# Patient Record
Sex: Male | Born: 1945 | Hispanic: Yes | Marital: Married | Smoking: Never smoker
Health system: Southern US, Community
[De-identification: ages and names within clinical notes are randomized; demographics above are authoritative.]

## PROBLEM LIST (undated history)

## (undated) DIAGNOSIS — C61 Malignant neoplasm of prostate: Secondary | ICD-10-CM

## (undated) DIAGNOSIS — I10 Essential (primary) hypertension: Secondary | ICD-10-CM

## (undated) DIAGNOSIS — E119 Type 2 diabetes mellitus without complications: Secondary | ICD-10-CM

## (undated) HISTORY — PX: OTHER SURGICAL HISTORY: SHX169

## (undated) HISTORY — PX: JOINT REPLACEMENT: SHX530

## (undated) HISTORY — PX: HERNIA REPAIR: SHX51

---

## 2002-12-24 HISTORY — PX: HERNIA REPAIR: SHX51

## 2005-09-14 ENCOUNTER — Emergency Department: Payer: Self-pay | Admitting: Emergency Medicine

## 2007-01-16 ENCOUNTER — Ambulatory Visit: Payer: Self-pay | Admitting: General Surgery

## 2011-03-04 ENCOUNTER — Emergency Department: Payer: Self-pay | Admitting: Emergency Medicine

## 2013-04-27 DIAGNOSIS — C61 Malignant neoplasm of prostate: Secondary | ICD-10-CM | POA: Insufficient documentation

## 2017-10-01 DIAGNOSIS — E119 Type 2 diabetes mellitus without complications: Secondary | ICD-10-CM | POA: Insufficient documentation

## 2017-10-22 DIAGNOSIS — E66811 Obesity, class 1: Secondary | ICD-10-CM | POA: Insufficient documentation

## 2017-10-22 DIAGNOSIS — E669 Obesity, unspecified: Secondary | ICD-10-CM | POA: Insufficient documentation

## 2017-10-22 DIAGNOSIS — M1711 Unilateral primary osteoarthritis, right knee: Secondary | ICD-10-CM | POA: Insufficient documentation

## 2017-10-23 ENCOUNTER — Other Ambulatory Visit: Payer: Self-pay | Admitting: Orthopedic Surgery

## 2017-10-23 DIAGNOSIS — G8929 Other chronic pain: Secondary | ICD-10-CM

## 2017-10-23 DIAGNOSIS — M25561 Pain in right knee: Principal | ICD-10-CM

## 2017-11-18 ENCOUNTER — Ambulatory Visit
Admission: RE | Admit: 2017-11-18 | Discharge: 2017-11-18 | Disposition: A | Discharge status: Home / Self Care | Payer: Medicare Other | Source: Ambulatory Visit | Attending: Orthopedic Surgery | Admitting: Orthopedic Surgery

## 2017-11-18 DIAGNOSIS — S83241A Other tear of medial meniscus, current injury, right knee, initial encounter: Secondary | ICD-10-CM | POA: Diagnosis not present

## 2017-11-18 DIAGNOSIS — X58XXXA Exposure to other specified factors, initial encounter: Secondary | ICD-10-CM | POA: Diagnosis not present

## 2017-11-18 DIAGNOSIS — M949 Disorder of cartilage, unspecified: Secondary | ICD-10-CM | POA: Insufficient documentation

## 2017-11-18 DIAGNOSIS — G8929 Other chronic pain: Secondary | ICD-10-CM | POA: Diagnosis not present

## 2017-11-18 DIAGNOSIS — M25561 Pain in right knee: Secondary | ICD-10-CM | POA: Diagnosis not present

## 2017-11-18 DIAGNOSIS — M25461 Effusion, right knee: Secondary | ICD-10-CM | POA: Diagnosis not present

## 2018-06-24 ENCOUNTER — Encounter: Payer: Self-pay | Admitting: Emergency Medicine

## 2018-06-24 ENCOUNTER — Other Ambulatory Visit: Payer: Self-pay

## 2018-06-24 ENCOUNTER — Inpatient Hospital Stay
Admission: EM | Admit: 2018-06-24 | Discharge: 2018-06-29 | DRG: 872 | Disposition: A | Discharge status: Home / Self Care | Payer: Medicare Other | Attending: Internal Medicine | Admitting: Internal Medicine

## 2018-06-24 ENCOUNTER — Emergency Department: Payer: Medicare Other

## 2018-06-24 DIAGNOSIS — K659 Peritonitis, unspecified: Secondary | ICD-10-CM | POA: Diagnosis present

## 2018-06-24 DIAGNOSIS — N179 Acute kidney failure, unspecified: Secondary | ICD-10-CM | POA: Diagnosis present

## 2018-06-24 DIAGNOSIS — Z809 Family history of malignant neoplasm, unspecified: Secondary | ICD-10-CM | POA: Diagnosis not present

## 2018-06-24 DIAGNOSIS — E876 Hypokalemia: Secondary | ICD-10-CM | POA: Diagnosis not present

## 2018-06-24 DIAGNOSIS — E119 Type 2 diabetes mellitus without complications: Secondary | ICD-10-CM | POA: Diagnosis present

## 2018-06-24 DIAGNOSIS — R1030 Lower abdominal pain, unspecified: Secondary | ICD-10-CM | POA: Diagnosis present

## 2018-06-24 DIAGNOSIS — E872 Acidosis: Secondary | ICD-10-CM | POA: Diagnosis present

## 2018-06-24 DIAGNOSIS — A020 Salmonella enteritis: Secondary | ICD-10-CM | POA: Diagnosis present

## 2018-06-24 DIAGNOSIS — Z833 Family history of diabetes mellitus: Secondary | ICD-10-CM

## 2018-06-24 DIAGNOSIS — E118 Type 2 diabetes mellitus with unspecified complications: Secondary | ICD-10-CM

## 2018-06-24 DIAGNOSIS — A419 Sepsis, unspecified organism: Secondary | ICD-10-CM

## 2018-06-24 DIAGNOSIS — R6520 Severe sepsis without septic shock: Secondary | ICD-10-CM | POA: Diagnosis present

## 2018-06-24 DIAGNOSIS — K529 Noninfective gastroenteritis and colitis, unspecified: Secondary | ICD-10-CM

## 2018-06-24 DIAGNOSIS — Z8546 Personal history of malignant neoplasm of prostate: Secondary | ICD-10-CM | POA: Diagnosis not present

## 2018-06-24 DIAGNOSIS — A021 Salmonella sepsis: Secondary | ICD-10-CM | POA: Diagnosis present

## 2018-06-24 HISTORY — DX: Type 2 diabetes mellitus without complications: E11.9

## 2018-06-24 HISTORY — DX: Malignant neoplasm of prostate: C61

## 2018-06-24 LAB — CBC WITH DIFFERENTIAL/PLATELET
Basophils Absolute: 0 10*3/uL (ref 0–0.1)
Basophils Relative: 0 %
Eosinophils Absolute: 0 10*3/uL (ref 0–0.7)
Eosinophils Relative: 0 %
HCT: 42.1 % (ref 40.0–52.0)
HEMOGLOBIN: 14.6 g/dL (ref 13.0–18.0)
LYMPHS PCT: 6 %
Lymphs Abs: 0.6 10*3/uL — ABNORMAL LOW (ref 1.0–3.6)
MCH: 31.4 pg (ref 26.0–34.0)
MCHC: 34.6 g/dL (ref 32.0–36.0)
MCV: 90.8 fL (ref 80.0–100.0)
MONO ABS: 0.3 10*3/uL (ref 0.2–1.0)
Monocytes Relative: 4 %
NEUTROS ABS: 7.8 10*3/uL — AB (ref 1.4–6.5)
NEUTROS PCT: 90 %
PLATELETS: 226 10*3/uL (ref 150–440)
RBC: 4.64 MIL/uL (ref 4.40–5.90)
RDW: 14.7 % — ABNORMAL HIGH (ref 11.5–14.5)
WBC: 8.7 10*3/uL (ref 3.8–10.6)

## 2018-06-24 LAB — COMPREHENSIVE METABOLIC PANEL
ALK PHOS: 44 U/L (ref 38–126)
ALT: 16 U/L (ref 0–44)
ANION GAP: 14 (ref 5–15)
AST: 25 U/L (ref 15–41)
Albumin: 4.4 g/dL (ref 3.5–5.0)
BILIRUBIN TOTAL: 2.3 mg/dL — AB (ref 0.3–1.2)
BUN: 29 mg/dL — ABNORMAL HIGH (ref 8–23)
CALCIUM: 9.1 mg/dL (ref 8.9–10.3)
CO2: 18 mmol/L — ABNORMAL LOW (ref 22–32)
Chloride: 96 mmol/L — ABNORMAL LOW (ref 98–111)
Creatinine, Ser: 2.14 mg/dL — ABNORMAL HIGH (ref 0.61–1.24)
GFR calc Af Amer: 34 mL/min — ABNORMAL LOW (ref >60)
GFR, EST NON AFRICAN AMERICAN: 29 mL/min — AB (ref >60)
Glucose, Bld: 160 mg/dL — ABNORMAL HIGH (ref 70–99)
POTASSIUM: 3.4 mmol/L — AB (ref 3.5–5.1)
Sodium: 128 mmol/L — ABNORMAL LOW (ref 135–145)
TOTAL PROTEIN: 7.9 g/dL (ref 6.5–8.1)

## 2018-06-24 LAB — URINALYSIS, ROUTINE W REFLEX MICROSCOPIC
Bilirubin Urine: NEGATIVE
Glucose, UA: NEGATIVE mg/dL
Ketones, ur: NEGATIVE mg/dL
Leukocytes, UA: NEGATIVE
Nitrite: NEGATIVE
PROTEIN: 30 mg/dL — AB
Specific Gravity, Urine: 1.02 (ref 1.005–1.030)
pH: 5 (ref 5.0–8.0)

## 2018-06-24 LAB — C DIFFICILE QUICK SCREEN W PCR REFLEX
C Diff antigen: NEGATIVE
C Diff interpretation: NOT DETECTED
C Diff toxin: NEGATIVE

## 2018-06-24 LAB — LACTIC ACID, PLASMA: Lactic Acid, Venous: 1.7 mmol/L (ref 0.5–1.9)

## 2018-06-24 LAB — TROPONIN I

## 2018-06-24 MED ORDER — ONDANSETRON HCL 4 MG/2ML IJ SOLN
4.0000 mg | Freq: Once | INTRAMUSCULAR | Status: AC
  Administered 2018-06-24: 4 mg via INTRAVENOUS
  Filled 2018-06-24: qty 2

## 2018-06-24 MED ORDER — SODIUM CHLORIDE 0.9 % IV BOLUS (SEPSIS)
1000.0000 mL | Freq: Once | INTRAVENOUS | Status: AC
  Administered 2018-06-24: 1000 mL via INTRAVENOUS

## 2018-06-24 MED ORDER — VANCOMYCIN HCL IN DEXTROSE 1-5 GM/200ML-% IV SOLN
1000.0000 mg | Freq: Once | INTRAVENOUS | Status: AC
  Administered 2018-06-24: 1000 mg via INTRAVENOUS
  Filled 2018-06-24: qty 200

## 2018-06-24 MED ORDER — ACETAMINOPHEN 325 MG PO TABS
650.0000 mg | ORAL_TABLET | Freq: Once | ORAL | Status: AC
  Administered 2018-06-24: 650 mg via ORAL

## 2018-06-24 MED ORDER — ACETAMINOPHEN 325 MG PO TABS
ORAL_TABLET | ORAL | Status: AC
Start: 2018-06-24 — End: 2018-06-24
  Administered 2018-06-24: 650 mg via ORAL
  Filled 2018-06-24: qty 2

## 2018-06-24 MED ORDER — PIPERACILLIN-TAZOBACTAM 3.375 G IVPB 30 MIN
3.3750 g | Freq: Once | INTRAVENOUS | Status: AC
  Administered 2018-06-24: 3.375 g via INTRAVENOUS
  Filled 2018-06-24: qty 50

## 2018-06-24 NOTE — Progress Notes (Signed)
CODE SEPSIS - PHARMACY COMMUNICATION  **Broad Spectrum Antibiotics should be administered within 1 hour of Sepsis diagnosis**  Time Code Sepsis Called/Page Received: 1934  Antibiotics Ordered: vanc/Zosyn  Time of 1st antibiotic administration: 2027  Additional action taken by pharmacy: Called MD for orders at 2017, Dr Clearnce Hasten stated he would put in broad spectrum abx Called RN at 2023 about timing of abx admin  If necessary, Name of Provider/Nurse Contacted: see above    Rocky Morel ,PharmD Clinical Pharmacist  06/24/2018  7:37 PM
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
## 2018-06-24 NOTE — H&P (Signed)
[place] at [place] NAME: Charles Cooke    MR#:  [id number]  DATE OF BIRTH:  1946/03/18  DATE OF ADMISSION:  06/24/2018  PRIMARY CARE PHYSICIAN: Alene Mires Elyse Jarvis, MD   REQUESTING/REFERRING PHYSICIAN: Clearnce Hasten, MD  CHIEF COMPLAINT:   Chief Complaint  Patient presents with  . Weakness    HISTORY OF PRESENT ILLNESS:  Charles Cooke  is a 72 y.o. male who presents with 3 days of intermittent fever and diarrhea.  Patient denies any blood in his diarrhea or significant abdominal pain.  Here in the ED he was found to meet sepsis criteria with bandemia, fever, tachycardia.  He has some mild AKI.  Patient states no one else around him has experienced any similar symptoms.  Hospitalist were called for admission for severe sepsis with suspected intra-abdominal infection  PAST MEDICAL HISTORY:   Past Medical History:  Diagnosis Date  . Diabetes mellitus without complication ([place])   . Prostate cancer ([place])      PAST SURGICAL HISTORY:   Past Surgical History:  Procedure Laterality Date  . HERNIA REPAIR    . prostate cancer       SOCIAL HISTORY:   Social History   Tobacco Use  . Smoking status: Never Smoker  . Smokeless tobacco: Never Used  Substance Use Topics  . Alcohol use: Yes    Comment: occasional     FAMILY HISTORY:   Family History  Problem Relation Age of Onset  . Diabetes Mother   . Cancer Father     Family history reviewed and is noncontributory. DRUG ALLERGIES:  No Known Allergies  MEDICATIONS AT HOME:   Prior to Admission medications   Not on File    REVIEW OF SYSTEMS:  Review of Systems  Constitutional: Positive for fever. Negative for chills, malaise/fatigue and weight loss.  HENT: Negative for ear pain, hearing loss and tinnitus.   Eyes: Negative for blurred vision, double vision, pain and redness.  Respiratory: Negative for cough, hemoptysis and shortness of breath.    Cardiovascular: Negative for chest pain, palpitations, orthopnea and leg swelling.  Gastrointestinal: Positive for diarrhea, nausea and vomiting. Negative for abdominal pain and constipation.  Genitourinary: Negative for dysuria, frequency and hematuria.  Musculoskeletal: Negative for back pain, joint pain and neck pain.  Skin:       No acne, rash, or lesions  Neurological: Negative for dizziness, tremors, focal weakness and weakness.  Endo/Heme/Allergies: Negative for polydipsia. Does not bruise/bleed easily.  Psychiatric/Behavioral: Negative for depression. The patient is not nervous/anxious and does not have insomnia.      VITAL SIGNS:   Vitals:   06/24/18 1941 06/24/18 2000 06/24/18 2100 06/24/18 2217  BP: (!) 98/58 98/60 (!) 100/58   Pulse: (!) 119 (!) 118 (!) 110   Resp: (!) 34 (!) 28    Temp: (!) 100.8 F (38.2 C)   (!) 102 F (38.9 C)  TempSrc: Oral   Oral  SpO2: 95% 94% 97%   Weight:      Height:       Wt Readings from Last 3 Encounters:  06/24/18 85.7 kg (189 lb)    PHYSICAL EXAMINATION:  Physical Exam  Vitals reviewed. Constitutional: He is oriented to person, place, and time. He appears well-developed and well-nourished. No distress.  HENT:  Head: Normocephalic and atraumatic.  Dry mucous membranes  Eyes: Pupils are equal, round, and reactive to light. Conjunctivae and EOM are normal. No scleral icterus.  Neck: Normal range of motion. Neck supple. No JVD present. No thyromegaly present.  Cardiovascular: Regular rhythm and intact distal pulses. Exam reveals no gallop and no friction rub.  No murmur heard. Tachycardic  Respiratory: Effort normal and breath sounds normal. No respiratory distress. He has no wheezes. He has no rales.  GI: Soft. Bowel sounds are normal. He exhibits no distension. There is no tenderness.  Musculoskeletal: Normal range of motion. He exhibits no edema.  No arthritis, no gout  Lymphadenopathy:    He has no cervical adenopathy.   Neurological: He is alert and oriented to person, place, and time. No cranial nerve deficit.  No dysarthria, no aphasia  Skin: Skin is warm and dry. No rash noted. No erythema.  Psychiatric: He has a normal mood and affect. His behavior is normal. Judgment and thought content normal.    LABORATORY PANEL:   CBC Recent Labs  Lab 06/24/18 2011  WBC 8.7  HGB 14.6  HCT 42.1  PLT 226   ------------------------------------------------------------------------------------------------------------------  Chemistries  Recent Labs  Lab 06/24/18 2011  NA 128*  K 3.4*  CL 96*  CO2 18*  GLUCOSE 160*  BUN 29*  CREATININE 2.14*  CALCIUM 9.1  AST 25  ALT 16  ALKPHOS 44  BILITOT 2.3*   ------------------------------------------------------------------------------------------------------------------  Cardiac Enzymes Recent Labs  Lab 06/24/18 2011  TROPONINI <0.03   ------------------------------------------------------------------------------------------------------------------  RADIOLOGY:  Ct Abdomen Pelvis Wo Contrast  Result Date: 06/24/2018 CLINICAL DATA:  72 year old male with acute generalized abdominal and pelvic pain. EXAM: CT ABDOMEN AND PELVIS WITHOUT CONTRAST TECHNIQUE: Multidetector CT imaging of the abdomen and pelvis was performed following the standard protocol without IV contrast. COMPARISON:  None. FINDINGS: Please note that parenchymal abnormalities may be missed without intravenous contrast. Lower chest: No acute abnormality. Hepatobiliary: Hepatic steatosis identified. The gallbladder is unremarkable. No biliary dilatation. Pancreas: Unremarkable Spleen: Unremarkable Adrenals/Urinary Tract: The kidneys, adrenal glands and bladder are unremarkable except for a RIGHT renal cyst. Stomach/Bowel: Fluid within the colon may represent a diarrheal state. There is no evidence of bowel obstruction or definite bowel wall thickening. The appendix is normal. Vascular/Lymphatic:  Aortic atherosclerosis. No enlarged abdominal or pelvic lymph nodes. Reproductive: Metallic clips/seeds within the prostate noted. Other: No ascites, pneumoperitoneum or focal collection. Inguinal hernia repair changes identified. Musculoskeletal: Mild compression of the T12 SUPERIOR endplate appears chronic but correlate clinically. No acute or suspicious bony abnormalities noted. IMPRESSION: 1. Fluid throughout the colon which may represent a diarrheal state. No other bowel abnormalities identified. 2. T12 SUPERIOR endplate compression-appears chronic but correlate with pain. 3. Hepatic steatosis 4.  Aortic Atherosclerosis (ICD10-I70.0). Electronically Signed   By: Margarette Canada M.D.   On: 06/24/2018 21:44   Dg Chest 1 View  Result Date: 06/24/2018 CLINICAL DATA:  Fever, weakness EXAM: CHEST  1 VIEW COMPARISON:  None. FINDINGS: The heart size and mediastinal contours are within normal limits. Both lungs are clear. The visualized skeletal structures are unremarkable. IMPRESSION: No active disease. Electronically Signed   By: Kathreen Devoid   On: 06/24/2018 20:37    EKG:   Orders placed or performed during the hospital encounter of 06/24/18  . ED EKG 12-Lead  . ED EKG 12-Lead  . EKG 12-Lead  . EKG 12-Lead    IMPRESSION AND PLAN:  Principal Problem:   Severe sepsis ([place]) -due to intra-abdominal infection, lactic acid within normal limits, patient is hemodynamically stable, IV antibiotics administered, cultures sent, C. difficile and GI panel pending Active Problems:   Abdominal  infection ([place]) -C. difficile and GI panel pending, antibiotics as above   AKI (acute kidney injury) ([place]) -IV fluids, avoid nephrotoxins and monitor   Diabetes ([place]) -sliding scale insulin with corresponding glucose checks  Chart review performed and case discussed with ED provider. Labs, imaging and/or ECG reviewed by provider and discussed with patient/family. Management plans discussed with the patient and/or  family.  DVT PROPHYLAXIS: SubQ lovenox  GI PROPHYLAXIS: None  ADMISSION STATUS: Inpatient  CODE STATUS: Full  TOTAL TIME TAKING CARE OF THIS PATIENT: 45 minutes.   Gracyn Allor [place] 06/24/2018, 11:45 PM  [employer]  [phone]  CC: Primary care physician; Theotis Burrow, MD  Note:  This document was prepared using Dragon voice recognition software and may include unintentional dictation errors.

## 2018-06-24 NOTE — ED Triage Notes (Addendum)
Pt to triage via w/c with no distress noted; pt reports lives with son who noted since this morning pt had slurred speech and "hasn't ate all day" with unsteady gait;; pt denies pain but c/o weakness ; denies c/o numbness/tingling or dizziness; pt A&Ox3, MAEW, grips strong & equal; smile symmetrical

## 2018-06-25 ENCOUNTER — Other Ambulatory Visit: Payer: Self-pay

## 2018-06-25 LAB — GLUCOSE, CAPILLARY
GLUCOSE-CAPILLARY: 152 mg/dL — AB (ref 70–99)
GLUCOSE-CAPILLARY: 190 mg/dL — AB (ref 70–99)
Glucose-Capillary: 167 mg/dL — ABNORMAL HIGH (ref 70–99)
Glucose-Capillary: 167 mg/dL — ABNORMAL HIGH (ref 70–99)
Glucose-Capillary: 167 mg/dL — ABNORMAL HIGH (ref 70–99)

## 2018-06-25 LAB — CBC
HCT: 37.8 % — ABNORMAL LOW (ref 40.0–52.0)
HEMOGLOBIN: 13.3 g/dL (ref 13.0–18.0)
MCH: 31.8 pg (ref 26.0–34.0)
MCHC: 35.2 g/dL (ref 32.0–36.0)
MCV: 90.2 fL (ref 80.0–100.0)
Platelets: 167 10*3/uL (ref 150–440)
RBC: 4.19 MIL/uL — ABNORMAL LOW (ref 4.40–5.90)
RDW: 15 % — ABNORMAL HIGH (ref 11.5–14.5)
WBC: 5.7 10*3/uL (ref 3.8–10.6)

## 2018-06-25 LAB — MAGNESIUM: MAGNESIUM: 1.5 mg/dL — AB (ref 1.7–2.4)

## 2018-06-25 LAB — GASTROINTESTINAL PANEL BY PCR, STOOL (REPLACES STOOL CULTURE)
Adenovirus F40/41: NOT DETECTED
Astrovirus: NOT DETECTED
CAMPYLOBACTER SPECIES: NOT DETECTED
CRYPTOSPORIDIUM: NOT DETECTED
CYCLOSPORA CAYETANENSIS: NOT DETECTED
ENTEROTOXIGENIC E COLI (ETEC): NOT DETECTED
Entamoeba histolytica: NOT DETECTED
Enteroaggregative E coli (EAEC): NOT DETECTED
Enteropathogenic E coli (EPEC): NOT DETECTED
Giardia lamblia: NOT DETECTED
Norovirus GI/GII: NOT DETECTED
PLESIMONAS SHIGELLOIDES: NOT DETECTED
ROTAVIRUS A: NOT DETECTED
SAPOVIRUS (I, II, IV, AND V): NOT DETECTED
SHIGA LIKE TOXIN PRODUCING E COLI (STEC): NOT DETECTED
Salmonella species: DETECTED — AB
Shigella/Enteroinvasive E coli (EIEC): NOT DETECTED
VIBRIO SPECIES: NOT DETECTED
Vibrio cholerae: NOT DETECTED
Yersinia enterocolitica: NOT DETECTED

## 2018-06-25 LAB — BASIC METABOLIC PANEL
ANION GAP: 10 (ref 5–15)
BUN: 25 mg/dL — ABNORMAL HIGH (ref 8–23)
CALCIUM: 8.1 mg/dL — AB (ref 8.9–10.3)
CO2: 18 mmol/L — ABNORMAL LOW (ref 22–32)
Chloride: 102 mmol/L (ref 98–111)
Creatinine, Ser: 1.23 mg/dL (ref 0.61–1.24)
GFR, EST NON AFRICAN AMERICAN: 57 mL/min — AB (ref >60)
Glucose, Bld: 181 mg/dL — ABNORMAL HIGH (ref 70–99)
Potassium: 2.9 mmol/L — ABNORMAL LOW (ref 3.5–5.1)
SODIUM: 130 mmol/L — AB (ref 135–145)

## 2018-06-25 LAB — POTASSIUM: Potassium: 3.4 mmol/L — ABNORMAL LOW (ref 3.5–5.1)

## 2018-06-25 MED ORDER — CIPROFLOXACIN HCL 500 MG PO TABS
500.0000 mg | ORAL_TABLET | Freq: Two times a day (BID) | ORAL | Status: DC
  Administered 2018-06-25 – 2018-06-29 (×9): 500 mg via ORAL
  Filled 2018-06-25 (×10): qty 1

## 2018-06-25 MED ORDER — VANCOMYCIN HCL IN DEXTROSE 1-5 GM/200ML-% IV SOLN
1000.0000 mg | INTRAVENOUS | Status: DC
  Administered 2018-06-25: 1000 mg via INTRAVENOUS
  Filled 2018-06-25 (×2): qty 200

## 2018-06-25 MED ORDER — INSULIN ASPART 100 UNIT/ML ~~LOC~~ SOLN
0.0000 [IU] | Freq: Every day | SUBCUTANEOUS | Status: DC
  Administered 2018-06-26 – 2018-06-27 (×2): 2 [IU] via SUBCUTANEOUS
  Filled 2018-06-25 (×2): qty 1

## 2018-06-25 MED ORDER — ENOXAPARIN SODIUM 30 MG/0.3ML ~~LOC~~ SOLN
30.0000 mg | SUBCUTANEOUS | Status: DC
  Administered 2018-06-25: 30 mg via SUBCUTANEOUS
  Filled 2018-06-25: qty 0.3

## 2018-06-25 MED ORDER — ONDANSETRON HCL 4 MG PO TABS
4.0000 mg | ORAL_TABLET | Freq: Four times a day (QID) | ORAL | Status: DC | PRN
  Administered 2018-06-27: 4 mg via ORAL
  Filled 2018-06-25: qty 1

## 2018-06-25 MED ORDER — INSULIN ASPART 100 UNIT/ML ~~LOC~~ SOLN
0.0000 [IU] | Freq: Three times a day (TID) | SUBCUTANEOUS | Status: DC
  Administered 2018-06-25 (×3): 2 [IU] via SUBCUTANEOUS
  Administered 2018-06-26 (×2): 3 [IU] via SUBCUTANEOUS
  Administered 2018-06-26 – 2018-06-27 (×2): 2 [IU] via SUBCUTANEOUS
  Administered 2018-06-27: 3 [IU] via SUBCUTANEOUS
  Administered 2018-06-27: 2 [IU] via SUBCUTANEOUS
  Administered 2018-06-28 (×2): 1 [IU] via SUBCUTANEOUS
  Administered 2018-06-28: 2 [IU] via SUBCUTANEOUS
  Filled 2018-06-25 (×12): qty 1

## 2018-06-25 MED ORDER — MAGNESIUM SULFATE 4 GM/100ML IV SOLN
4.0000 g | Freq: Once | INTRAVENOUS | Status: AC
  Administered 2018-06-25: 4 g via INTRAVENOUS
  Filled 2018-06-25: qty 100

## 2018-06-25 MED ORDER — SODIUM CHLORIDE 0.9 % IV SOLN
1.0000 g | Freq: Two times a day (BID) | INTRAVENOUS | Status: DC
  Filled 2018-06-25 (×3): qty 1

## 2018-06-25 MED ORDER — POTASSIUM CHLORIDE 10 MEQ/100ML IV SOLN
10.0000 meq | INTRAVENOUS | Status: AC
  Administered 2018-06-25 (×2): 10 meq via INTRAVENOUS
  Filled 2018-06-25 (×2): qty 100

## 2018-06-25 MED ORDER — ACETAMINOPHEN 650 MG RE SUPP: 650.0000 mg | Freq: Four times a day (QID) | RECTAL | Status: DC | PRN

## 2018-06-25 MED ORDER — ONDANSETRON HCL 4 MG/2ML IJ SOLN
4.0000 mg | Freq: Four times a day (QID) | INTRAMUSCULAR | Status: DC | PRN
  Administered 2018-06-25 – 2018-06-27 (×3): 4 mg via INTRAVENOUS
  Filled 2018-06-25 (×3): qty 2

## 2018-06-25 MED ORDER — POTASSIUM CHLORIDE CRYS ER 20 MEQ PO TBCR: 40.0000 meq | EXTENDED_RELEASE_TABLET | ORAL | Status: DC

## 2018-06-25 MED ORDER — POTASSIUM CHLORIDE CRYS ER 20 MEQ PO TBCR
40.0000 meq | EXTENDED_RELEASE_TABLET | ORAL | Status: DC
  Administered 2018-06-25 (×2): 40 meq via ORAL
  Filled 2018-06-25 (×2): qty 2

## 2018-06-25 MED ORDER — ACETAMINOPHEN 325 MG PO TABS
650.0000 mg | ORAL_TABLET | Freq: Four times a day (QID) | ORAL | Status: DC | PRN
  Administered 2018-06-25 – 2018-06-29 (×3): 650 mg via ORAL
  Filled 2018-06-25 (×3): qty 2

## 2018-06-25 MED ORDER — POTASSIUM CHLORIDE IN NACL 20-0.9 MEQ/L-% IV SOLN
INTRAVENOUS | Status: DC
  Administered 2018-06-25 (×2): via INTRAVENOUS
  Filled 2018-06-25 (×3): qty 1000

## 2018-06-25 MED ORDER — PNEUMOCOCCAL VAC POLYVALENT 25 MCG/0.5ML IJ INJ
0.5000 mL | INJECTION | INTRAMUSCULAR | Status: DC
  Filled 2018-06-25: qty 0.5

## 2018-06-25 MED ORDER — SODIUM CHLORIDE 0.9 % IV SOLN: INTRAVENOUS | Status: DC

## 2018-06-25 MED ORDER — ENOXAPARIN SODIUM 40 MG/0.4ML ~~LOC~~ SOLN
40.0000 mg | SUBCUTANEOUS | Status: DC
  Administered 2018-06-26 – 2018-06-29 (×4): 40 mg via SUBCUTANEOUS
  Filled 2018-06-25 (×4): qty 0.4

## 2018-06-25 NOTE — Progress Notes (Signed)
[place] at [place] NAME: Charles Cooke    MR#:  [id number]  DATE OF BIRTH:  02-04-1946  SUBJECTIVE:  the interpreter. Patient came in with profuse diarrhea for last three days. Denies abdominal pain today. No ability stools. No fever. Feels a little better. Continues with watery stools.  REVIEW OF SYSTEMS:   Review of Systems  Constitutional: Negative for chills, fever and weight loss.  HENT: Negative for ear discharge, ear pain and nosebleeds.   Eyes: Negative for blurred vision, pain and discharge.  Respiratory: Negative for sputum production, shortness of breath, wheezing and stridor.   Cardiovascular: Negative for chest pain, palpitations, orthopnea and PND.  Gastrointestinal: Positive for diarrhea. Negative for abdominal pain, nausea and vomiting.  Genitourinary: Negative for frequency and urgency.  Musculoskeletal: Negative for back pain and joint pain.  Neurological: Negative for sensory change, speech change, focal weakness and weakness.  Psychiatric/Behavioral: Negative for depression and hallucinations. The patient is not nervous/anxious.    Tolerating Diet:yes Tolerating PT: not needed  DRUG ALLERGIES:  No Known Allergies  VITALS:  Blood pressure 100/65, pulse (!) 103, temperature 99.4 F (37.4 C), temperature source Oral, resp. rate 18, height 5' (1.524 m), weight 85.1 kg (187 lb 9.6 oz), SpO2 97 %.  PHYSICAL EXAMINATION:   Physical Exam  GENERAL:  72 y.o.-year-old patient lying in the bed with no acute distress.  EYES: Pupils equal, round, reactive to light and accommodation. No scleral icterus. Extraocular muscles intact.  HEENT: Head atraumatic, normocephalic. Oropharynx and nasopharynx clear.  NECK:  Supple, no jugular venous distention. No thyroid enlargement, no tenderness.  LUNGS: Normal breath sounds bilaterally, no wheezing, rales, rhonchi. No use of accessory muscles of respiration.   CARDIOVASCULAR: S1, S2 normal. No murmurs, rubs, or gallops.  ABDOMEN: Soft, nontender, nondistended. Bowel sounds present. No organomegaly or mass.  EXTREMITIES: No cyanosis, clubbing or edema b/l.    NEUROLOGIC: Cranial nerves II through XII are intact. No focal Motor or sensory deficits b/l.   PSYCHIATRIC:  patient is alert and oriented x 3.  SKIN: No obvious rash, lesion, or ulcer.   LABORATORY PANEL:  CBC Recent Labs  Lab 06/25/18 0447  WBC 5.7  HGB 13.3  HCT 37.8*  PLT 167    Chemistries  Recent Labs  Lab 06/24/18 2011 06/25/18 0447  NA 128* 130*  K 3.4* 2.9*  CL 96* 102  CO2 18* 18*  GLUCOSE 160* 181*  BUN 29* 25*  CREATININE 2.14* 1.23  CALCIUM 9.1 8.1*  MG  --  1.5*  AST 25  --   ALT 16  --   ALKPHOS 44  --   BILITOT 2.3*  --    Cardiac Enzymes Recent Labs  Lab 06/24/18 2011  TROPONINI <0.03   RADIOLOGY:  Ct Abdomen Pelvis Wo Contrast  Result Date: 06/24/2018 CLINICAL DATA:  72 year old male with acute generalized abdominal and pelvic pain. EXAM: CT ABDOMEN AND PELVIS WITHOUT CONTRAST TECHNIQUE: Multidetector CT imaging of the abdomen and pelvis was performed following the standard protocol without IV contrast. COMPARISON:  None. FINDINGS: Please note that parenchymal abnormalities may be missed without intravenous contrast. Lower chest: No acute abnormality. Hepatobiliary: Hepatic steatosis identified. The gallbladder is unremarkable. No biliary dilatation. Pancreas: Unremarkable Spleen: Unremarkable Adrenals/Urinary Tract: The kidneys, adrenal glands and bladder are unremarkable except for a RIGHT renal cyst. Stomach/Bowel: Fluid within the colon may represent a diarrheal state. There is no evidence of bowel obstruction or definite bowel  wall thickening. The appendix is normal. Vascular/Lymphatic: Aortic atherosclerosis. No enlarged abdominal or pelvic lymph nodes. Reproductive: Metallic clips/seeds within the prostate noted. Other: No ascites,  pneumoperitoneum or focal collection. Inguinal hernia repair changes identified. Musculoskeletal: Mild compression of the T12 SUPERIOR endplate appears chronic but correlate clinically. No acute or suspicious bony abnormalities noted. IMPRESSION: 1. Fluid throughout the colon which may represent a diarrheal state. No other bowel abnormalities identified. 2. T12 SUPERIOR endplate compression-appears chronic but correlate with pain. 3. Hepatic steatosis 4.  Aortic Atherosclerosis (ICD10-I70.0). Electronically Signed   By: Margarette Canada M.D.   On: 06/24/2018 21:44   Dg Chest 1 View  Result Date: 06/24/2018 CLINICAL DATA:  Fever, weakness EXAM: CHEST  1 VIEW COMPARISON:  None. FINDINGS: The heart size and mediastinal contours are within normal limits. Both lungs are clear. The visualized skeletal structures are unremarkable. IMPRESSION: No active disease. Electronically Signed   By: Kathreen Devoid   On: 06/24/2018 20:37   ASSESSMENT AND PLAN:  Buzz Axel  is a 72 y.o. male who presents with 3 days of intermittent fever and diarrhea.  * sepsis ([place]) -due to intra-abdominal infection with Salmonella Gastroenterities -  lactic acid within normal limits, patient is hemodynamically stable -GI panel shows salmonella -cipro 500 mg bid -FLD  *  Abdominal infection ([facility])  Asb above Pt deneeis any abdominal pain  *  AKI (acute kidney injury) ([place]) -IV fluids, avoid nephrotoxins and monitor -replete K  *  Diabetes ([place]) -sliding scale insulin with corresponding glucose checks   Case discussed with Care Management/Social Worker. Management plans discussed with the patient, family and they are in agreement.  CODE STATUS: FULL  DVT Prophylaxis: lovenox  TOTAL TIME TAKING CARE OF THIS PATIENT: *25* minutes.  >50% time spent on counselling and coordination of care  POSSIBLE D/C IN 1-2* DAYS, DEPENDING ON CLINICAL CONDITION.  Note: This dictation was prepared with Dragon dictation along with  smaller phrase technology. Any transcriptional errors that result from this process are unintentional.  Fritzi Mandes M.D on 06/25/2018 at 2:17 PM  Between 7am to 6pm - Pager - [phone]  After 6pm go to [email or web address] - password [email or web address] [facility]  Office  [phone]  CC: Primary care physician; Theotis Burrow, MDPatient ID: Charles Cooke, male   DOB: Sep 15, 1946, 72 y.o.   MRN: [id number]

## 2018-06-25 NOTE — Progress Notes (Signed)
Pharmacy consulted for electrolyte replacement protocol:   Goal of therapy: Electrolytes within normal limits:  K 3.5 - 5.1 Corrected Ca 8.9 - 10.3 Phos 2.5 - 4.6 Mg 1.7 - 2.4   Assessment: Lab Results  Component Value Date   CREATININE 1.23 06/25/2018   BUN 25 (H) 06/25/2018   NA 130 (L) 06/25/2018   K 3.4 (L) 06/25/2018   CL 102 06/25/2018   CO2 18 (L) 06/25/2018    Plan: K 3.4, Will give KCl 85meq iv q1h x 2 doses and recheck in AM per protocol.   Thomasenia Sales, PharmD, MBA, [facility]

## 2018-06-25 NOTE — Progress Notes (Signed)
Spanish interpreter was use  At this time , family at bedside   translater [place]  [phone]

## 2018-06-25 NOTE — Progress Notes (Signed)
Tylenol given for elevated temperature

## 2018-06-25 NOTE — Progress Notes (Signed)
Pharmacy Antibiotic Note  Charles Cooke is a 72 y.o. male admitted on 06/24/2018 with sepsis.  Pharmacy has been consulted for vanc/meropenem dosing. Patient received vanc 1g and zosyn 3.375g IV x 1 in ED. Consult for zosyn was switched to meropenem.  Plan: Will start vanc 1g IV q24h w/ 6 hour stack  Will draw vanc trough 07/06 @ 0100 prior to 4th dose. Will start meropenem 1g IV q12h per CrCl < 50 ml/min  Ke 0.0304 T1/2 24 hrs Goal trough 15 - 20 mcg/mL  Height: 5' 4.96" (165 cm) Weight: 189 lb (85.7 kg) IBW/kg (Calculated) : 61.41  Temp (24hrs), Avg:101.5 F (38.6 C), Min:100.8 F (38.2 C), Max:102 F (38.9 C)  Recent Labs  Lab 06/24/18 2011  WBC 8.7  CREATININE 2.14*  LATICACIDVEN 1.7    Estimated Creatinine Clearance: 31.4 mL/min (A) (by C-G formula based on SCr of 2.14 mg/dL (H)).    No Known Allergies  Thank you for allowing pharmacy to be a part of this patient's care.  Tobie Lords, PharmD, BCPS Clinical Pharmacist 06/25/2018

## 2018-06-25 NOTE — Progress Notes (Signed)
Patient profile completed via Spanish translator Charles Cooke [phone]. Son is at the bedside. No complaints of pain. Patient continues to have diarrhea.

## 2018-06-25 NOTE — Plan of Care (Signed)
  Problem: Clinical Measurements: Goal: Will remain free from infection Outcome: Progressing Goal: Diagnostic test results will improve Outcome: Progressing   

## 2018-06-25 NOTE — Progress Notes (Signed)
Spanish interpreter used for assessment and plan for the evening. Charles Cooke [phone]

## 2018-06-25 NOTE — Progress Notes (Signed)
Spanish interpreter was used for med pass. Interpreter name Broughton # [phone]

## 2018-06-25 NOTE — Consult Note (Signed)
MEDICATION RELATED CONSULT NOTE - INITIAL   Pharmacy Consult for electrolytes Indication: hypokalemia  No Known Allergies  Patient Measurements: Height: 5' (152.4 cm) Weight: 187 lb 9.6 oz (85.1 kg) IBW/kg (Calculated) : 50 Adjusted Body Weight:   Vital Signs: Temp: 99.4 F (37.4 C) (07/03 0724) Temp Source: Oral (07/03 0724) BP: 100/65 (07/03 0724) Pulse Rate: 103 (07/03 0724) Intake/Output from previous day: 07/02 0701 - 07/03 0700 In: 803.3 [I.V.:600; IV Piggyback:203.3] Out: -  Intake/Output from this shift: No intake/output data recorded.  Labs: Recent Labs    06/24/18 2011 06/25/18 0447  WBC 8.7 5.7  HGB 14.6 13.3  HCT 42.1 37.8*  PLT 226 167  CREATININE 2.14* 1.23  MG  --  1.5*  ALBUMIN 4.4  --   PROT 7.9  --   AST 25  --   ALT 16  --   ALKPHOS 44  --   BILITOT 2.3*  --    Estimated Creatinine Clearance: 49.1 mL/min (by C-G formula based on SCr of 1.23 mg/dL).   Microbiology: Recent Results (from the past 720 hour(s))  Blood Culture (routine x 2)     Status: None (Preliminary result)   Collection Time: 06/24/18  8:11 PM  Result Value Ref Range Status   Specimen Description BLOOD LEFT ANTECUBITAL  Final   Special Requests   Final    BOTTLES DRAWN AEROBIC AND ANAEROBIC Blood Culture results may not be optimal due to an excessive volume of blood received in culture bottles   Culture   Final    NO GROWTH < 12 HOURS Performed at [facility], [address] [place], [place] [address]    Report Status PENDING  Incomplete  Blood Culture (routine x 2)     Status: None (Preliminary result)   Collection Time: 06/24/18  8:12 PM  Result Value Ref Range Status   Specimen Description BLOOD BLOOD LEFT WRIST  Final   Special Requests   Final    BOTTLES DRAWN AEROBIC AND ANAEROBIC Blood Culture adequate volume   Culture   Final    NO GROWTH < 12 HOURS Performed at [facility], [address] [place], [place] [address]    Report Status  PENDING  Incomplete  C difficile quick scan w PCR reflex     Status: None   Collection Time: 06/24/18 10:16 PM  Result Value Ref Range Status   C Diff antigen NEGATIVE NEGATIVE Final   C Diff toxin NEGATIVE NEGATIVE Final   C Diff interpretation No C. difficile detected.  Final    Comment: Performed at [facility], [address] [place], [place] [address]  Gastrointestinal Panel by PCR , Stool     Status: Abnormal   Collection Time: 06/24/18 10:16 PM  Result Value Ref Range Status   Campylobacter species NOT DETECTED NOT DETECTED Final   Plesimonas shigelloides NOT DETECTED NOT DETECTED Final   Salmonella species DETECTED (A) NOT DETECTED Final    Comment: RESULT CALLED TO, READ BACK BY AND VERIFIED WITH: JENNIFER DALEY ON 06/25/18 AT 0001 BY JAG    Yersinia enterocolitica NOT DETECTED NOT DETECTED Final   Vibrio species NOT DETECTED NOT DETECTED Final   Vibrio cholerae NOT DETECTED NOT DETECTED Final   Enteroaggregative E coli (EAEC) NOT DETECTED NOT DETECTED Final   Enteropathogenic E coli (EPEC) NOT DETECTED NOT DETECTED Final   Enterotoxigenic E coli (ETEC) NOT DETECTED NOT DETECTED Final   Shiga like toxin producing E coli (STEC) NOT DETECTED  NOT DETECTED Final   Shigella/Enteroinvasive E coli (EIEC) NOT DETECTED NOT DETECTED Final   Cryptosporidium NOT DETECTED NOT DETECTED Final   Cyclospora cayetanensis NOT DETECTED NOT DETECTED Final   Entamoeba histolytica NOT DETECTED NOT DETECTED Final   Giardia lamblia NOT DETECTED NOT DETECTED Final   Adenovirus F40/41 NOT DETECTED NOT DETECTED Final   Astrovirus NOT DETECTED NOT DETECTED Final   Norovirus GI/GII NOT DETECTED NOT DETECTED Final   Rotavirus A NOT DETECTED NOT DETECTED Final   Sapovirus (I, II, IV, and V) NOT DETECTED NOT DETECTED Final    Comment: Performed at [facility], [address], [place], [place] [address]    Medical History: Past Medical History:  Diagnosis Date  . Diabetes  mellitus without complication ([place])   . Prostate cancer ([place])     Medications:  Scheduled:  . ciprofloxacin  500 mg Oral BID  . [START ON 06/26/2018] enoxaparin (LOVENOX) injection  40 mg Subcutaneous Q24H  . insulin aspart  0-5 Units Subcutaneous QHS  . insulin aspart  0-9 Units Subcutaneous TID WC  . [START ON 06/26/2018] pneumococcal 23 valent vaccine  0.5 mL Intramuscular Tomorrow-1000    Assessment: Patient is a 72 year old male admitted with salmonella GI infection. Pt K has fallen to 2.9, Mg 1.5  Goal of Therapy:  Normalization of electrolytes  Plan:  K has already received 80 MEQ of KCL po this AM. Add on Mg resulted at 1.5. Will order 4g IV Mg once. Will add 20 KCL to NS running at 129ml/hr (3MEQ/hr). Will check a K level at 1500.  Ramond Dial, Pharm.D, BCPS Clinical Pharmacist 06/25/2018,12:53 PM

## 2018-06-25 NOTE — Progress Notes (Signed)
Patient's potassium level is 2.9, notified Dr. Sabra Heck.  Received orders for oral potassium.

## 2018-06-26 LAB — BASIC METABOLIC PANEL
ANION GAP: 14 (ref 5–15)
Anion gap: 13 (ref 5–15)
BUN: 30 mg/dL — ABNORMAL HIGH (ref 8–23)
BUN: 40 mg/dL — ABNORMAL HIGH (ref 8–23)
CALCIUM: 9.3 mg/dL (ref 8.9–10.3)
CO2: 19 mmol/L — AB (ref 22–32)
CO2: 20 mmol/L — ABNORMAL LOW (ref 22–32)
CREATININE: 1 mg/dL (ref 0.61–1.24)
CREATININE: 1.43 mg/dL — AB (ref 0.61–1.24)
Calcium: 8.9 mg/dL (ref 8.9–10.3)
Chloride: 97 mmol/L — ABNORMAL LOW (ref 98–111)
Chloride: 92 mmol/L — ABNORMAL LOW (ref 98–111)
GFR calc non Af Amer: >60 mL/min (ref >60)
GFR, EST AFRICAN AMERICAN: 55 mL/min — AB (ref >60)
GFR, EST NON AFRICAN AMERICAN: 47 mL/min — AB (ref >60)
GLUCOSE: 192 mg/dL — AB (ref 70–99)
Glucose, Bld: 250 mg/dL — ABNORMAL HIGH (ref 70–99)
Potassium: 3.7 mmol/L (ref 3.5–5.1)
Potassium: 3.9 mmol/L (ref 3.5–5.1)
SODIUM: 126 mmol/L — AB (ref 135–145)
Sodium: 129 mmol/L — ABNORMAL LOW (ref 135–145)

## 2018-06-26 LAB — BLOOD CULTURE ID PANEL (REFLEXED)
Acinetobacter baumannii: NOT DETECTED
Candida albicans: NOT DETECTED
Candida glabrata: NOT DETECTED
Candida krusei: NOT DETECTED
Candida parapsilosis: NOT DETECTED
Candida tropicalis: NOT DETECTED
Carbapenem resistance: NOT DETECTED
ENTEROBACTER CLOACAE COMPLEX: NOT DETECTED
ENTEROBACTERIACEAE SPECIES: DETECTED — AB
ENTEROCOCCUS SPECIES: NOT DETECTED
ESCHERICHIA COLI: NOT DETECTED
Haemophilus influenzae: NOT DETECTED
Klebsiella oxytoca: NOT DETECTED
Klebsiella pneumoniae: NOT DETECTED
LISTERIA MONOCYTOGENES: NOT DETECTED
NEISSERIA MENINGITIDIS: NOT DETECTED
PSEUDOMONAS AERUGINOSA: NOT DETECTED
Proteus species: NOT DETECTED
STREPTOCOCCUS AGALACTIAE: NOT DETECTED
STREPTOCOCCUS PNEUMONIAE: NOT DETECTED
STREPTOCOCCUS PYOGENES: NOT DETECTED
Serratia marcescens: NOT DETECTED
Staphylococcus aureus (BCID): NOT DETECTED
Staphylococcus species: NOT DETECTED
Streptococcus species: NOT DETECTED

## 2018-06-26 LAB — GLUCOSE, CAPILLARY
GLUCOSE-CAPILLARY: 244 mg/dL — AB (ref 70–99)
GLUCOSE-CAPILLARY: 232 mg/dL — AB (ref 70–99)
Glucose-Capillary: 233 mg/dL — ABNORMAL HIGH (ref 70–99)

## 2018-06-26 LAB — URINE CULTURE: Culture: NO GROWTH

## 2018-06-26 LAB — MAGNESIUM: MAGNESIUM: 3 mg/dL — AB (ref 1.7–2.4)

## 2018-06-26 MED ORDER — POTASSIUM CHLORIDE CRYS ER 20 MEQ PO TBCR
40.0000 meq | EXTENDED_RELEASE_TABLET | Freq: Two times a day (BID) | ORAL | Status: AC
  Administered 2018-06-26 (×2): 40 meq via ORAL
  Filled 2018-06-26 (×2): qty 2

## 2018-06-26 MED ORDER — SODIUM BICARBONATE 8.4 % IV SOLN
INTRAVENOUS | Status: DC
  Administered 2018-06-26 (×2): via INTRAVENOUS
  Filled 2018-06-26 (×4): qty 100

## 2018-06-26 MED ORDER — LOPERAMIDE HCL 2 MG PO CAPS
2.0000 mg | ORAL_CAPSULE | Freq: Four times a day (QID) | ORAL | Status: DC | PRN
  Administered 2018-06-26: 2 mg via ORAL
  Filled 2018-06-26: qty 1

## 2018-06-26 MED ORDER — ZOLPIDEM TARTRATE 5 MG PO TABS
5.0000 mg | ORAL_TABLET | Freq: Every evening | ORAL | Status: DC | PRN
Start: 2018-06-26 — End: 2018-06-29
  Administered 2018-06-26 – 2018-06-27 (×2): 5 mg via ORAL
  Filled 2018-06-26 (×3): qty 1

## 2018-06-26 NOTE — Progress Notes (Signed)
PHARMACY - PHYSICIAN COMMUNICATION CRITICAL VALUE ALERT - BLOOD CULTURE IDENTIFICATION (BCID)  Charles Cooke is an 72 y.o. male who presented to [facility] on 06/24/2018 with a chief complaint of weakness  Assessment:  Tachycardic w/ Tmax 102, CT Fluid throughout the colon which may represent a diarrheal state. GI panel positive for salmonella species, 1/4 GNR BCID Enterobacteriacaea no organism identified  Name of physician (or Provider) Contacted: Arta Silence  Current antibiotics: Ciprofloxacin   Changes to prescribed antibiotics recommended:  Patient is on recommended antibiotics - No changes needed  Results for orders placed or performed during the hospital encounter of 06/24/18  Blood Culture ID Panel (Reflexed) (Collected: 06/24/2018  8:12 PM)  Result Value Ref Range   Enterococcus species NOT DETECTED NOT DETECTED   Listeria monocytogenes NOT DETECTED NOT DETECTED   Staphylococcus species NOT DETECTED NOT DETECTED   Staphylococcus aureus NOT DETECTED NOT DETECTED   Streptococcus species NOT DETECTED NOT DETECTED   Streptococcus agalactiae NOT DETECTED NOT DETECTED   Streptococcus pneumoniae NOT DETECTED NOT DETECTED   Streptococcus pyogenes NOT DETECTED NOT DETECTED   Acinetobacter baumannii NOT DETECTED NOT DETECTED   Enterobacteriaceae species DETECTED (A) NOT DETECTED   Enterobacter cloacae complex NOT DETECTED NOT DETECTED   Escherichia coli NOT DETECTED NOT DETECTED   Klebsiella oxytoca NOT DETECTED NOT DETECTED   Klebsiella pneumoniae NOT DETECTED NOT DETECTED   Proteus species NOT DETECTED NOT DETECTED   Serratia marcescens NOT DETECTED NOT DETECTED   Carbapenem resistance NOT DETECTED NOT DETECTED   Haemophilus influenzae NOT DETECTED NOT DETECTED   Neisseria meningitidis NOT DETECTED NOT DETECTED   Pseudomonas aeruginosa NOT DETECTED NOT DETECTED   Candida albicans NOT DETECTED NOT DETECTED   Candida glabrata NOT DETECTED NOT DETECTED   Candida krusei  NOT DETECTED NOT DETECTED   Candida parapsilosis NOT DETECTED NOT DETECTED   Candida tropicalis NOT DETECTED NOT DETECTED   Tobie Lords, PharmD, BCPS Clinical Pharmacist 06/26/2018

## 2018-06-26 NOTE — Progress Notes (Signed)
Used translator on Haematologist # [id number] For assessment, medication, lab results, and answering questions.

## 2018-06-26 NOTE — Progress Notes (Signed)
[place] at [place] NAME: Charles Cooke    MR#:  [id number]  DATE OF BIRTH:  [birth year]  SUBJECTIVE:  the interpreter. Patient came in with profuse diarrhea for last three days. Denies abdominal pain today.  No fever. Feels a little better. Continues with watery stools.  REVIEW OF SYSTEMS:   Review of Systems  Constitutional: Negative for chills, fever and weight loss.  HENT: Negative for ear discharge, ear pain and nosebleeds.   Eyes: Negative for blurred vision, pain and discharge.  Respiratory: Negative for sputum production, shortness of breath, wheezing and stridor.   Cardiovascular: Negative for chest pain, palpitations, orthopnea and PND.  Gastrointestinal: Positive for diarrhea. Negative for abdominal pain, nausea and vomiting.  Genitourinary: Negative for frequency and urgency.  Musculoskeletal: Negative for back pain and joint pain.  Neurological: Negative for sensory change, speech change, focal weakness and weakness.  Psychiatric/Behavioral: Negative for depression and hallucinations. The patient is not nervous/anxious.    Tolerating Diet:yes Tolerating PT: not needed  DRUG ALLERGIES:  No Known Allergies  VITALS:  Blood pressure 119/82, pulse 84, temperature 98.1 F (36.7 C), temperature source Oral, resp. rate 18, height 5' (1.524 m), weight 85.1 kg (187 lb 9.6 oz), SpO2 96 %.  PHYSICAL EXAMINATION:   Physical Exam  GENERAL:  72 y.o.-year-old patient lying in the bed with no acute distress.  EYES: Pupils equal, round, reactive to light and accommodation. No scleral icterus. Extraocular muscles intact.  HEENT: Head atraumatic, normocephalic. Oropharynx and nasopharynx clear.  NECK:  Supple, no jugular venous distention. No thyroid enlargement, no tenderness.  LUNGS: Normal breath sounds bilaterally, no wheezing, rales, rhonchi. No use of accessory muscles of respiration.  CARDIOVASCULAR: S1, S2 normal. No  murmurs, rubs, or gallops.  ABDOMEN: Soft, nontender, nondistended. Bowel sounds present. No organomegaly or mass.  EXTREMITIES: No cyanosis, clubbing or edema b/l.    NEUROLOGIC: Cranial nerves II through XII are intact. No focal Motor or sensory deficits b/l.   PSYCHIATRIC:  patient is alert and oriented x 3.  SKIN: No obvious rash, lesion, or ulcer.   LABORATORY PANEL:  CBC Recent Labs  Lab 06/25/18 0447  WBC 5.7  HGB 13.3  HCT 37.8*  PLT 167    Chemistries  Recent Labs  Lab 06/24/18 2011  06/26/18 0516  NA 128*   < > 129*  K 3.4*   < > 3.7  CL 96*   < > 97*  CO2 18*   < > 19*  GLUCOSE 160*   < > 192*  BUN 29*   < > 30*  CREATININE 2.14*   < > 1.00  CALCIUM 9.1   < > 8.9  MG  --    < > 3.0*  AST 25  --   --   ALT 16  --   --   ALKPHOS 44  --   --   BILITOT 2.3*  --   --    < > = values in this interval not displayed.   Cardiac Enzymes Recent Labs  Lab 06/24/18 2011  TROPONINI <0.03   RADIOLOGY:  Ct Abdomen Pelvis Wo Contrast  Result Date: 06/24/2018 CLINICAL DATA:  72 year old male with acute generalized abdominal and pelvic pain. EXAM: CT ABDOMEN AND PELVIS WITHOUT CONTRAST TECHNIQUE: Multidetector CT imaging of the abdomen and pelvis was performed following the standard protocol without IV contrast. COMPARISON:  None. FINDINGS: Please note that parenchymal abnormalities may be missed without intravenous  contrast. Lower chest: No acute abnormality. Hepatobiliary: Hepatic steatosis identified. The gallbladder is unremarkable. No biliary dilatation. Pancreas: Unremarkable Spleen: Unremarkable Adrenals/Urinary Tract: The kidneys, adrenal glands and bladder are unremarkable except for a RIGHT renal cyst. Stomach/Bowel: Fluid within the colon may represent a diarrheal state. There is no evidence of bowel obstruction or definite bowel wall thickening. The appendix is normal. Vascular/Lymphatic: Aortic atherosclerosis. No enlarged abdominal or pelvic lymph nodes.  Reproductive: Metallic clips/seeds within the prostate noted. Other: No ascites, pneumoperitoneum or focal collection. Inguinal hernia repair changes identified. Musculoskeletal: Mild compression of the T12 SUPERIOR endplate appears chronic but correlate clinically. No acute or suspicious bony abnormalities noted. IMPRESSION: 1. Fluid throughout the colon which may represent a diarrheal state. No other bowel abnormalities identified. 2. T12 SUPERIOR endplate compression-appears chronic but correlate with pain. 3. Hepatic steatosis 4.  Aortic Atherosclerosis (ICD10-I70.0). Electronically Signed   By: Margarette Canada M.D.   On: 06/24/2018 21:44   Dg Chest 1 View  Result Date: 06/24/2018 CLINICAL DATA:  Fever, weakness EXAM: CHEST  1 VIEW COMPARISON:  None. FINDINGS: The heart size and mediastinal contours are within normal limits. Both lungs are clear. The visualized skeletal structures are unremarkable. IMPRESSION: No active disease. Electronically Signed   By: Kathreen Devoid   On: 06/24/2018 20:37   ASSESSMENT AND PLAN:  Charles Cooke  is a 72 y.o. male who presents with 3 days of intermittent fever and diarrhea.  * sepsis ([place]) -due to intra-abdominal infection with Salmonella Gastroenterities -BC 1/2 GNR  -  lactic acid within normal limits, patient is hemodynamically stable -GI panel shows salmonella -cipro 500 mg bid -FLD--advance to regular so pt eats better  *  Abdominal infection ([place]) as above Pt denies any abdominal pain  *  AKI (acute kidney injury) ([place]) with acidosis -IV fluids, avoid nephrotoxins and monitor -replete K -change to IV bicarb gtt  *  Diabetes ([facility]) -sliding scale insulin with corresponding glucose checks   Case discussed with Care Management/Social Worker. Management plans discussed with the patient, family and they are in agreement.  CODE STATUS: FULL  DVT Prophylaxis: lovenox  TOTAL TIME TAKING CARE OF THIS PATIENT: *25* minutes.  >50% time spent on  counselling and coordination of care  POSSIBLE D/C IN 1-2* DAYS, DEPENDING ON CLINICAL CONDITION.  Note: This dictation was prepared with Dragon dictation along with smaller phrase technology. Any transcriptional errors that result from this process are unintentional.  Fritzi Mandes M.D on 06/26/2018 at 1:55 PM  Between 7am to 6pm - Pager - [phone]  After 6pm go to [email or web address] - password [email or web address] [place] [facility]  Office  [phone]  CC: Primary care physician; Theotis Burrow, MDPatient ID: Charles Cooke, male   DOB: August 18, 1946, 72 y.o.   MRN: [id number]

## 2018-06-26 NOTE — Progress Notes (Signed)
Talked to Dr. Anselm Jungling about patient's multiple loose stool, patient is positive for salmonella asked if patient can have anti-diarrheal. Order for Imodium 2mg  qid, prn. Also asked for sleeping aid, order for Ambien 5mg  given. RN will continue to monitor.

## 2018-06-26 NOTE — Progress Notes (Signed)
Patient's first ambien medication was lost in the bed, looked for the medication but cannot find it. Pulled another ambien to give the patient.

## 2018-06-26 NOTE — Progress Notes (Signed)
Spanish interpreter used for medication. Marcie Bal #[id number]

## 2018-06-26 NOTE — Progress Notes (Signed)
Pharmacy consulted for electrolyte replacement protocol:   Goal of therapy: Electrolytes within normal limits:  K 3.5 - 5.1 Corrected Ca 8.9 - 10.3 Phos 2.5 - 4.6 Mg 1.7 - 2.4   Assessment: Lab Results  Component Value Date   CREATININE 1.00 06/26/2018   BUN 30 (H) 06/26/2018   NA 129 (L) 06/26/2018   K 3.7 06/26/2018   CL 97 (L) 06/26/2018   CO2 19 (L) 06/26/2018    Plan: K 3.4, Will give KCl 64meq iv q1h x 2 doses and recheck in AM per protocol.  06/26/18 05:16 K 3.7, Mg 3. Patient received magnesium sulfate 4 gm IV x 1 yesterday as well as Klor-Con 40 mEq po BID x 2 doses and potassium chloride 10 mEq IV Q1H x 2 doses. Will give additional klor-con 40 mEq po BID x 2 doses and recheck electrolytes tomorrow with AM labs.   Harvy Riera A. [place], [place].D., BCPS Clinical Pharmacist [facility]

## 2018-06-27 LAB — MAGNESIUM: MAGNESIUM: 2.9 mg/dL — AB (ref 1.7–2.4)

## 2018-06-27 LAB — BASIC METABOLIC PANEL
ANION GAP: 17 — AB (ref 5–15)
BUN: 46 mg/dL — ABNORMAL HIGH (ref 8–23)
CALCIUM: 9 mg/dL (ref 8.9–10.3)
CO2: 23 mmol/L (ref 22–32)
Chloride: 87 mmol/L — ABNORMAL LOW (ref 98–111)
Creatinine, Ser: 1.55 mg/dL — ABNORMAL HIGH (ref 0.61–1.24)
GFR calc Af Amer: 50 mL/min — ABNORMAL LOW (ref >60)
GFR, EST NON AFRICAN AMERICAN: 43 mL/min — AB (ref >60)
GLUCOSE: 220 mg/dL — AB (ref 70–99)
Potassium: 3.5 mmol/L (ref 3.5–5.1)
SODIUM: 127 mmol/L — AB (ref 135–145)

## 2018-06-27 LAB — GLUCOSE, CAPILLARY
GLUCOSE-CAPILLARY: 202 mg/dL — AB (ref 70–99)
Glucose-Capillary: 195 mg/dL — ABNORMAL HIGH (ref 70–99)
Glucose-Capillary: 194 mg/dL — ABNORMAL HIGH (ref 70–99)
Glucose-Capillary: 172 mg/dL — ABNORMAL HIGH (ref 70–99)
Glucose-Capillary: 204 mg/dL — ABNORMAL HIGH (ref 70–99)

## 2018-06-27 LAB — PHOSPHORUS: Phosphorus: 3.6 mg/dL (ref 2.5–4.6)

## 2018-06-27 MED ORDER — POTASSIUM CHLORIDE CRYS ER 20 MEQ PO TBCR
40.0000 meq | EXTENDED_RELEASE_TABLET | ORAL | Status: AC
  Administered 2018-06-27 (×3): 40 meq via ORAL
  Filled 2018-06-27 (×3): qty 2

## 2018-06-27 MED ORDER — SODIUM CHLORIDE 0.9 % IV SOLN
INTRAVENOUS | Status: DC
  Administered 2018-06-27: 07:00:00 via INTRAVENOUS

## 2018-06-27 MED ORDER — LACTATED RINGERS IV SOLN
INTRAVENOUS | Status: DC
  Administered 2018-06-27 – 2018-06-29 (×4): via INTRAVENOUS

## 2018-06-27 NOTE — Progress Notes (Signed)
Patient ambulated around the nurses station x2 on room. Patient states he has "bad knees" so he would not be able to do a lot of walking. No complaints of shortness of breath, pain, or nausea while ambulating. Will continue to monitor patient.

## 2018-06-27 NOTE — Progress Notes (Signed)
[place] at [place] NAME: Charles Cooke    MR#:  [id number]  DATE OF BIRTH:  1946/01/09  SUBJECTIVE:  Via video interpreter. Patient came in with profuse diarrhea for last three days. Denies abdominal pain today.  No fever. Feels a little better. Continues with watery stools although decreased in frequency. He keeps changing his story about BM every 30 mins and then every 2 hours which does not match what is documented by nursing  REVIEW OF SYSTEMS:   Review of Systems  Constitutional: Negative for chills, fever and weight loss.  HENT: Negative for ear discharge, ear pain and nosebleeds.   Eyes: Negative for blurred vision, pain and discharge.  Respiratory: Negative for sputum production, shortness of breath, wheezing and stridor.   Cardiovascular: Negative for chest pain, palpitations, orthopnea and PND.  Gastrointestinal: Positive for diarrhea. Negative for abdominal pain, nausea and vomiting.  Genitourinary: Negative for frequency and urgency.  Musculoskeletal: Negative for back pain and joint pain.  Neurological: Negative for sensory change, speech change, focal weakness and weakness.  Psychiatric/Behavioral: Negative for depression and hallucinations. The patient is not nervous/anxious.    Tolerating Diet:yes Tolerating PT: not needed  DRUG ALLERGIES:  No Known Allergies  VITALS:  Blood pressure 122/89, pulse 85, temperature (!) 97.5 F (36.4 C), temperature source Oral, resp. rate 18, height 5' (1.524 m), weight 85.1 kg (187 lb 9.6 oz), SpO2 96 %.  PHYSICAL EXAMINATION:   Physical Exam  GENERAL:  72 y.o.-year-old patient lying in the bed with no acute distress.  EYES: Pupils equal, round, reactive to light and accommodation. No scleral icterus. Extraocular muscles intact.  HEENT: Head atraumatic, normocephalic. Oropharynx and nasopharynx clear.  NECK:  Supple, no jugular venous distention. No thyroid enlargement,  no tenderness.  LUNGS: Normal breath sounds bilaterally, no wheezing, rales, rhonchi. No use of accessory muscles of respiration.  CARDIOVASCULAR: S1, S2 normal. No murmurs, rubs, or gallops.  ABDOMEN: Soft, nontender, nondistended. Bowel sounds present. No organomegaly or mass.  EXTREMITIES: No cyanosis, clubbing or edema b/l.    NEUROLOGIC: Cranial nerves II through XII are intact. No focal Motor or sensory deficits b/l.   PSYCHIATRIC:  patient is alert and oriented x 3.  SKIN: No obvious rash, lesion, or ulcer.   LABORATORY PANEL:  CBC Recent Labs  Lab 06/25/18 0447  WBC 5.7  HGB 13.3  HCT 37.8*  PLT 167    Chemistries  Recent Labs  Lab 06/24/18 2011  06/27/18 0524  NA 128*   < > 127*  K 3.4*   < > 3.5  CL 96*   < > 87*  CO2 18*   < > 23  GLUCOSE 160*   < > 220*  BUN 29*   < > 46*  CREATININE 2.14*   < > 1.55*  CALCIUM 9.1   < > 9.0  MG  --    < > 2.9*  AST 25  --   --   ALT 16  --   --   ALKPHOS 44  --   --   BILITOT 2.3*  --   --    < > = values in this interval not displayed.   Cardiac Enzymes Recent Labs  Lab 06/24/18 2011  TROPONINI <0.03   RADIOLOGY:  No results found. ASSESSMENT AND PLAN:  Charles Cooke  is a 72 y.o. male who presents with 3 days of intermittent fever and diarrhea.  * sepsis ([place]) -  due to intra-abdominal infection with Salmonella Gastroenterities -BC 1/2 GNR  -  lactic acid within normal limits, patient is hemodynamically stable -GI panel shows salmonella -cipro 500 mg bid -FLD--advance to regular so pt eats better  *  Abdominal infection ([place]) as above Pt denies any abdominal pain  *  AKI (acute kidney injury) ([place]) with acidosis -IV fluids, avoid nephrotoxins and monitor -replete K -changed to IV bicarb gtt--now bicarb normal but sodium and chloride trending down with acidosis. Will curbside nehrology  *  Diabetes ([place]) -sliding scale insulin with corresponding glucose checks   Case discussed with Care  Management/Social Worker. Management plans discussed with the patient, family and they are in agreement.  CODE STATUS: FULL  DVT Prophylaxis: lovenox  TOTAL TIME TAKING CARE OF THIS PATIENT: *25* minutes.  >50% time spent on counselling and coordination of care  POSSIBLE D/C IN 1-2* DAYS, DEPENDING ON CLINICAL CONDITION.  Note: This dictation was prepared with Dragon dictation along with smaller phrase technology. Any transcriptional errors that result from this process are unintentional.  Fritzi Mandes M.D on 06/27/2018 at 8:00 AM  Between 7am to 6pm - Pager - [phone]  After 6pm go to [email or web address] - password [email or web address] [place] [facility]  Office  [phone]  CC: Primary care physician; Theotis Burrow, MDPatient ID: Charles Cooke, male   DOB: 09-Mar-1946, 72 y.o.   MRN: [id number]

## 2018-06-27 NOTE — Care Management Important Message (Signed)
Copy of signed IM left with patient in room.  

## 2018-06-27 NOTE — Progress Notes (Signed)
Pharmacy consulted for electrolyte replacement protocol:   Goal of therapy: Electrolytes within normal limits:  K 3.5 - 5.1 Corrected Ca 8.9 - 10.3 Phos 2.5 - 4.6 Mg 1.7 - 2.4   Assessment: Lab Results  Component Value Date   CREATININE 1.55 (H) 06/27/2018   BUN 46 (H) 06/27/2018   NA 127 (L) 06/27/2018   K 3.5 06/27/2018   CL 87 (L) 06/27/2018   CO2 23 06/27/2018    Plan: 06/25/18: K 3.4, gave KCl 68meq iv q1h x 2 doses, KCl 18mEq po q4h x 2. magnesium sulfate 4 gm IV x 1    06/26/18 05:16 K 3.7, Mg 3. Gave additional klor-con 40 mEq po BID x 2 doses and recheck electrolytes tomorrow with AM labs.   06/27/18: K 3.5, Mg 2.9, phosphorous 3.6. Potassium repletion has been refractory to replacement therapy and level remains on the very low end of normal. Given continued diarrhea I will replace with 128mEq po KCl (3mEq po KCl q4h x 3) and check labs again in the morning  Vallery Sa, Pharm.[facility]

## 2018-06-27 NOTE — Progress Notes (Signed)
Inpatient Diabetes Program Recommendations  AACE/ADA: New Consensus Statement on Inpatient Glycemic Control (2015)  Target Ranges:  Prepandial:   less than 140 mg/dL      Peak postprandial:   less than 180 mg/dL (1-2 hours)      Critically ill patients:  140 - 180 mg/dL   Lab Results  Component Value Date   GLUCAP 194 (H) 06/27/2018    Review of Glycemic ControlResults for [name], [name] (MRN [id number]) as of 06/27/2018 13:36  Ref. Range 06/26/2018 11:51 06/26/2018 16:35 06/26/2018 21:02 06/27/2018 07:50 06/27/2018 12:03  Glucose-Capillary Latest Ref Range: 70 - 99 mg/dL 244 (H) 232 (H) 233 (H) 202 (H) 194 (H)    Diabetes history: Type 2 DM Outpatient Diabetes medications:  Metformin 1000 mg bid Current orders for Inpatient glycemic control:  Novolog sensitive tid with meals and SH  Inpatient Diabetes Program Recommendations:   Please consider adding Levemir 10 units daily while in the hospital.   Thanks,  Adah Perl, RN, BC-ADM Inpatient Diabetes Coordinator Pager [phone] (8a-5p)

## 2018-06-28 LAB — GLUCOSE, CAPILLARY
GLUCOSE-CAPILLARY: 114 mg/dL — AB (ref 70–99)
Glucose-Capillary: 143 mg/dL — ABNORMAL HIGH (ref 70–99)
Glucose-Capillary: 153 mg/dL — ABNORMAL HIGH (ref 70–99)
Glucose-Capillary: 130 mg/dL — ABNORMAL HIGH (ref 70–99)

## 2018-06-28 LAB — BASIC METABOLIC PANEL
ANION GAP: 11 (ref 5–15)
BUN: 31 mg/dL — ABNORMAL HIGH (ref 8–23)
CALCIUM: 9 mg/dL (ref 8.9–10.3)
CO2: 22 mmol/L (ref 22–32)
Chloride: 95 mmol/L — ABNORMAL LOW (ref 98–111)
Creatinine, Ser: 0.99 mg/dL (ref 0.61–1.24)
GFR calc Af Amer: >60 mL/min (ref >60)
GFR calc non Af Amer: >60 mL/min (ref >60)
GLUCOSE: 145 mg/dL — AB (ref 70–99)
POTASSIUM: 4.4 mmol/L (ref 3.5–5.1)
Sodium: 128 mmol/L — ABNORMAL LOW (ref 135–145)

## 2018-06-28 MED ORDER — LOPERAMIDE HCL 2 MG PO CAPS
2.0000 mg | ORAL_CAPSULE | Freq: Four times a day (QID) | ORAL | Status: DC
  Administered 2018-06-28 – 2018-06-29 (×5): 2 mg via ORAL
  Filled 2018-06-28 (×5): qty 1

## 2018-06-28 NOTE — Plan of Care (Signed)
  Problem: Education: Goal: Knowledge of General Education information will improve Outcome: Progressing   Problem: Activity: Goal: Risk for activity intolerance will decrease Outcome: Progressing Note:  Patient ambulated around the nurses station today with no issues or complaints.     Problem: Elimination: Goal: Will not experience complications related to bowel motility Outcome: Progressing Goal: Will not experience complications related to urinary retention Outcome: Progressing   Problem: Safety: Goal: Ability to remain free from injury will improve Outcome: Progressing   Problem: Skin Integrity: Goal: Risk for impaired skin integrity will decrease Outcome: Progressing

## 2018-06-28 NOTE — Progress Notes (Signed)
[facility] [place] NAME: Charles Cooke    MR#:  [id number]  DATE OF BIRTH:  1946/05/12  SUBJECTIVE:  Via video interpreter. Patient came in with profuse diarrhea for last three days. Denies abdominal pain today.  No fever. Feels a little better. Continues with watery stools although decreased in frequency. Eating better Stool output 1000 cc  REVIEW OF SYSTEMS:   Review of Systems  Constitutional: Negative for chills, fever and weight loss.  HENT: Negative for ear discharge, ear pain and nosebleeds.   Eyes: Negative for blurred vision, pain and discharge.  Respiratory: Negative for sputum production, shortness of breath, wheezing and stridor.   Cardiovascular: Negative for chest pain, palpitations, orthopnea and PND.  Gastrointestinal: Positive for diarrhea. Negative for abdominal pain, nausea and vomiting.  Genitourinary: Negative for frequency and urgency.  Musculoskeletal: Negative for back pain and joint pain.  Neurological: Negative for sensory change, speech change, focal weakness and weakness.  Psychiatric/Behavioral: Negative for depression and hallucinations. The patient is not nervous/anxious.    Tolerating Diet:yes Tolerating PT: not needed  DRUG ALLERGIES:  No Known Allergies  VITALS:  Blood pressure 126/79, pulse 76, temperature 98.2 F (36.8 C), temperature source Oral, resp. rate 18, height 5' (1.524 m), weight 85.1 kg (187 lb 9.6 oz), SpO2 99 %.  PHYSICAL EXAMINATION:   Physical Exam  GENERAL:  72 y.o.-year-old patient lying in the bed with no acute distress.  EYES: Pupils equal, round, reactive to light and accommodation. No scleral icterus. Extraocular muscles intact.  HEENT: Head atraumatic, normocephalic. Oropharynx and nasopharynx clear.  NECK:  Supple, no jugular venous distention. No thyroid enlargement, no tenderness.  LUNGS: Normal breath sounds bilaterally, no wheezing, rales, rhonchi. No use  of accessory muscles of respiration.  CARDIOVASCULAR: S1, S2 normal. No murmurs, rubs, or gallops.  ABDOMEN: Soft, nontender, nondistended. Bowel sounds present. No organomegaly or mass.  EXTREMITIES: No cyanosis, clubbing or edema b/l.    NEUROLOGIC: Cranial nerves II through XII are intact. No focal Motor or sensory deficits b/l.   PSYCHIATRIC:  patient is alert and oriented x 3.  SKIN: No obvious rash, lesion, or ulcer.   LABORATORY PANEL:  CBC Recent Labs  Lab 06/25/18 0447  WBC 5.7  HGB 13.3  HCT 37.8*  PLT 167    Chemistries  Recent Labs  Lab 06/24/18 2011  06/27/18 0524 06/28/18 0440  NA 128*   < > 127* 128*  K 3.4*   < > 3.5 4.4  CL 96*   < > 87* 95*  CO2 18*   < > 23 22  GLUCOSE 160*   < > 220* 145*  BUN 29*   < > 46* 31*  CREATININE 2.14*   < > 1.55* 0.99  CALCIUM 9.1   < > 9.0 9.0  MG  --    < > 2.9*  --   AST 25  --   --   --   ALT 16  --   --   --   ALKPHOS 44  --   --   --   BILITOT 2.3*  --   --   --    < > = values in this interval not displayed.   Cardiac Enzymes Recent Labs  Lab 06/24/18 2011  TROPONINI <0.03   RADIOLOGY:  No results found. ASSESSMENT AND PLAN:  Charles Cooke  is a 72 y.o. male who presents with 3 days of intermittent fever  and diarrhea.  * sepsis ([place]) -due to intra-abdominal infection with Salmonella Gastroenterities -BC 1/2 GNR  -  lactic acid within normal limits, patient is hemodynamically stable -GI panel shows salmonella -cipro 500 mg bid -FLD--advance to regular so pt eats better  *  Abdominal infection ([place]) as above Pt denies any abdominal pain  *  AKI (acute kidney injury) ([place]) with acidosis -IV fluids, avoid nephrotoxins and monitor -replete K -changed to IV bicarb gtt--now bicarb normal but sodium and chloride trending down with acidosis now improved with LR IVF  *  Diabetes (HCC) -sliding scale insulin with corresponding glucose checks  Will keep ot for one more day--d/c tomorrow if stays  stable  Case discussed with Care Management/Social Worker. Management plans discussed with the patient, family and they are in agreement.  CODE STATUS: FULL  DVT Prophylaxis: lovenox  TOTAL TIME TAKING CARE OF THIS PATIENT: *25* minutes.  >50% time spent on counselling and coordination of care  POSSIBLE D/C IN 1-2* DAYS, DEPENDING ON CLINICAL CONDITION.  Note: This dictation was prepared with Dragon dictation along with smaller phrase technology. Any transcriptional errors that result from this process are unintentional.  Fritzi Mandes M.D on 06/28/2018 at 12:07 PM  Between 7am to 6pm - Pager - [phone]  After 6pm go to [email or web address] - password [email or web address] [place]  Office  [phone]  CC: Primary care physician; Theotis Burrow, MDPatient ID: Charles Cooke, male   DOB: 12-Feb-1946, 72 y.o.   MRN: [id number]

## 2018-06-28 NOTE — Progress Notes (Signed)
Pharmacy consulted for electrolyte replacement protocol:   Goal of therapy: Electrolytes within normal limits:  K 3.5 - 5.1 Corrected Ca 8.9 - 10.3 Phos 2.5 - 4.6 Mg 1.7 - 2.4   Assessment: Lab Results  Component Value Date   CREATININE 0.99 06/28/2018   BUN 31 (H) 06/28/2018   NA 128 (L) 06/28/2018   K 4.4 06/28/2018   CL 95 (L) 06/28/2018   CO2 22 06/28/2018    Plan:  No electrolyte supplementation is warranted at this time.   Will recheck with am labs.    Curt Jews, [facility] Clinical Pharmacist 06/28/18, 8:30 AM

## 2018-06-29 LAB — BASIC METABOLIC PANEL
ANION GAP: 8 (ref 5–15)
BUN: 18 mg/dL (ref 8–23)
CALCIUM: 8.5 mg/dL — AB (ref 8.9–10.3)
CO2: 24 mmol/L (ref 22–32)
Chloride: 99 mmol/L (ref 98–111)
Creatinine, Ser: 0.72 mg/dL (ref 0.61–1.24)
GFR calc non Af Amer: >60 mL/min (ref >60)
Glucose, Bld: 121 mg/dL — ABNORMAL HIGH (ref 70–99)
POTASSIUM: 3.9 mmol/L (ref 3.5–5.1)
Sodium: 131 mmol/L — ABNORMAL LOW (ref 135–145)

## 2018-06-29 LAB — GLUCOSE, CAPILLARY
GLUCOSE-CAPILLARY: 147 mg/dL — AB (ref 70–99)
Glucose-Capillary: 118 mg/dL — ABNORMAL HIGH (ref 70–99)

## 2018-06-29 LAB — CULTURE, BLOOD (ROUTINE X 2): Culture: NO GROWTH

## 2018-06-29 MED ORDER — CIPROFLOXACIN HCL 500 MG PO TABS: 500.0000 mg | ORAL_TABLET | Freq: Two times a day (BID) | ORAL | 0 refills | Status: DC

## 2018-06-29 NOTE — Progress Notes (Signed)
Pharmacy consulted for electrolyte replacement protocol:   Goal of therapy: Electrolytes within normal limits:  K 3.5 - 5.1 Corrected Ca 8.9 - 10.3 Phos 2.5 - 4.6 Mg 1.7 - 2.4   Assessment: Lab Results  Component Value Date   CREATININE 0.72 06/29/2018   BUN 18 06/29/2018   NA 131 (L) 06/29/2018   K 3.9 06/29/2018   CL 99 06/29/2018   CO2 24 06/29/2018    Plan:  No electrolyte supplementation is warranted at this time.   Will recheck with am labs.    Curt Jews, [facility] Clinical Pharmacist 06/28/18, 8:30 AM

## 2018-06-29 NOTE — Progress Notes (Signed)
Charles Cooke to be D/C'd Home per MD order.  Discussed prescriptions and follow up appointments with the patient. Prescription given to patient, medication list explained in detail. Pt verbalized understanding. [employer] video interpreter used for discharge paperwork  Allergies as of 06/29/2018   No Known Allergies     Medication List    TAKE these medications   aspirin EC 81 MG tablet Take 81 mg by mouth daily.   atorvastatin 40 MG tablet Commonly known as:  LIPITOR Take 80 mg by mouth daily.   ciprofloxacin 500 MG tablet Commonly known as:  CIPRO Take 1 tablet (500 mg total) by mouth 2 (two) times daily.   lisinopril 2.5 MG tablet Commonly known as:  PRINIVIL,ZESTRIL Take 2.5 mg by mouth daily.   metFORMIN 500 MG 24 hr tablet Commonly known as:  GLUCOPHAGE-XR Take 1,000 mg by mouth 2 (two) times daily.   tamsulosin 0.4 MG Caps capsule Commonly known as:  FLOMAX Take 0.4 mg by mouth daily.       Vitals:   06/29/18 0350 06/29/18 0826  BP: 114/79 118/81  Pulse: 72 75  Resp: 18   Temp: 98.6 F (37 C) 98.5 F (36.9 C)  SpO2: 97% 96%    Skin clean, dry and intact without evidence of skin break down, no evidence of skin tears noted. IV catheter discontinued intact. Site without signs and symptoms of complications. Dressing and pressure applied. Pt denies pain at this time. No complaints noted.  An After Visit Summary was printed and given to the patient. Patient escorted via [place], and D/C home via private auto.  [place]

## 2018-06-29 NOTE — Discharge Summary (Signed)
Charles Cooke    MR#:  [id number]  DATE OF BIRTH:  Sep 19, 1946  DATE OF ADMISSION:  06/24/2018 ADMITTING PHYSICIAN: Charles Coon, MD  DATE OF DISCHARGE: 06/29/2018  PRIMARY CARE PHYSICIAN: Charles Burrow, MD    ADMISSION DIAGNOSIS:  Colitis [K52.9] Sepsis, due to unspecified organism [facility]) [A41.9] Acute renal failure, unspecified acute renal failure type ([place]) [N17.9]  DISCHARGE DIAGNOSIS:  Salmonella sepsis Salmonella Gastroenteritis Electrolyte abnormality due to GI losses  SECONDARY DIAGNOSIS:   Past Medical History:  Diagnosis Date  . Diabetes mellitus without complication ([place])   . Prostate cancer [facility])     HOSPITAL COURSE:   Charles Canois a72 y.o.malewho presents with 3 days of intermittent fever and diarrhea.  * sepsis ([place]) -due to intra-abdominal infection with Salmonella Gastroenterities -BC 1/2 GNR --salmonella -  lactic acid within normal limits, patient is hemodynamically stable -GI panel shows salmonella -cipro 500 mg bid  For 10-12 days -FLD--advance to regular so pt eats better  *Abdominal infection ([place]) as above Pt denies any abdominal pain  *AKI (acute kidney injury) ([place]) with acidosis -received IV fluids, avoid nephrotoxins and monitor -repleted K -changed to IV bicarb gtt--now bicarb normal but sodium and chloride trending down with acidosis now improved with LR IVF---corrected acidosis now  *Diabetes (HCC) -sliding scale insulin with corresponding glucose checks  Overall better. D/c home. Pt agreeable   CONSULTS OBTAINED:    DRUG ALLERGIES:  No Known Allergies  DISCHARGE MEDICATIONS:   Allergies as of 06/29/2018   No Known Allergies     Medication List    TAKE these medications   aspirin EC 81 MG tablet Take 81 mg by mouth daily.   atorvastatin 40 MG tablet Commonly known as:  LIPITOR Take 80 mg by mouth daily.    ciprofloxacin 500 MG tablet Commonly known as:  CIPRO Take 1 tablet (500 mg total) by mouth 2 (two) times daily.   lisinopril 2.5 MG tablet Commonly known as:  PRINIVIL,ZESTRIL Take 2.5 mg by mouth daily.   metFORMIN 500 MG 24 hr tablet Commonly known as:  GLUCOPHAGE-XR Take 1,000 mg by mouth 2 (two) times daily.   tamsulosin 0.4 MG Caps capsule Commonly known as:  FLOMAX Take 0.4 mg by mouth daily.       If you experience worsening of your admission symptoms, develop shortness of breath, life threatening emergency, suicidal or homicidal thoughts you must seek medical attention immediately by calling 911 or calling your MD immediately  if symptoms less severe.  You Must read complete instructions/literature along with all the possible adverse reactions/side effects for all the Medicines you take and that have been prescribed to you. Take any new Medicines after you have completely understood and accept all the possible adverse reactions/side effects.   Please note  You were cared for by a hospitalist during your hospital stay. If you have any questions about your discharge medications or the care you received while you were in the hospital after you are discharged, you can call the unit and asked to speak with the hospitalist on call if the hospitalist that took care of you is not available. Once you are discharged, your primary care physician will handle any further medical issues. Please note that NO REFILLS for any discharge medications will be authorized once you are discharged, as it is imperative that you return to your primary care physician (or establish a relationship with a primary  care physician if you do not have one) for your aftercare needs so that they can reassess your need for medications and monitor your lab values. Today   SUBJECTIVE    Doing well Via video interpreter VITAL SIGNS:  Blood pressure 118/81, pulse 75, temperature 98.5 F (36.9 C), temperature  source Oral, resp. rate 18, height 5' (1.524 m), weight 85.1 kg (187 lb 9.6 oz), SpO2 96 %.  I/O:    Intake/Output Summary (Last 24 hours) at 06/29/2018 1029 Last data filed at 06/29/2018 1006 Gross per 24 hour  Intake 2666.08 ml  Output 1025 ml  Net 1641.08 ml    PHYSICAL EXAMINATION:  GENERAL:  72 y.o.-year-old patient lying in the bed with no acute distress.  EYES: Pupils equal, round, reactive to light and accommodation. No scleral icterus. Extraocular muscles intact.  HEENT: Head atraumatic, normocephalic. Oropharynx and nasopharynx clear.  NECK:  Supple, no jugular venous distention. No thyroid enlargement, no tenderness.  LUNGS: Normal breath sounds bilaterally, no wheezing, rales,rhonchi or crepitation. No use of accessory muscles of respiration.  CARDIOVASCULAR: S1, S2 normal. No murmurs, rubs, or gallops.  ABDOMEN: Soft, non-tender, non-distended. Bowel sounds present. No organomegaly or mass.  EXTREMITIES: No pedal edema, cyanosis, or clubbing.  NEUROLOGIC: Cranial nerves II through XII are intact. Muscle strength 5/5 in all extremities. Sensation intact. Gait not checked.  PSYCHIATRIC: The patient is alert and oriented x 3.  SKIN: No obvious rash, lesion, or ulcer.   DATA REVIEW:   CBC  Recent Labs  Lab 06/25/18 0447  WBC 5.7  HGB 13.3  HCT 37.8*  PLT 167    Chemistries  Recent Labs  Lab 06/24/18 2011  06/27/18 0524  06/29/18 0340  NA 128*   < > 127*   < > 131*  K 3.4*   < > 3.5   < > 3.9  CL 96*   < > 87*   < > 99  CO2 18*   < > 23   < > 24  GLUCOSE 160*   < > 220*   < > 121*  BUN 29*   < > 46*   < > 18  CREATININE 2.14*   < > 1.55*   < > 0.72  CALCIUM 9.1   < > 9.0   < > 8.5*  MG  --    < > 2.9*  --   --   AST 25  --   --   --   --   ALT 16  --   --   --   --   ALKPHOS 44  --   --   --   --   BILITOT 2.3*  --   --   --   --    < > = values in this interval not displayed.    Microbiology Results   Recent Results (from the past 240 hour(s))   Blood Culture (routine x 2)     Status: None   Collection Time: 06/24/18  8:11 PM  Result Value Ref Range Status   Specimen Description BLOOD LEFT ANTECUBITAL  Final   Special Requests   Final    BOTTLES DRAWN AEROBIC AND ANAEROBIC Blood Culture results may not be optimal due to an excessive volume of blood received in culture bottles   Culture   Final    NO GROWTH 5 DAYS Performed at [facility], [address], [place], [place] [address]    Report Status 06/29/2018 FINAL  Final  Urine culture     Status: None   Collection Time: 06/24/18  8:11 PM  Result Value Ref Range Status   Specimen Description   Final    URINE, RANDOM Performed at [facility], [address], [place], [place] [address]    Special Requests   Final    NONE Performed at [facility], [address], [place], [place] [address]    Culture   Final    NO GROWTH Performed at [facility], [place] [address], [place], [place] [address]    Report Status 06/26/2018 FINAL  Final  Blood Culture (routine x 2)     Status: Abnormal (Preliminary result)   Collection Time: 06/24/18  8:12 PM  Result Value Ref Range Status   Specimen Description BLOOD LEFT WRIST  Final   Special Requests   Final    BOTTLES DRAWN AEROBIC AND ANAEROBIC Blood Culture adequate volume Performed at [facility], [address] [place], [place] [address]    Culture  Setup Time   Final    GRAM NEGATIVE RODS ANAEROBIC BOTTLE ONLY CRITICAL RESULT CALLED TO, READ BACK BY AND VERIFIED WITH: DAVID BESANTI ON 06/26/18 AT [place] (A)  Final    [place] NOTIFIED Referred to [facility] in [place], [place] for Serotyping. CRITICAL RESULT CALLED TO, READ BACK BY AND VERIFIED WITH: DR. Miroslav Gin (AR) AT 4010 ON 06/27/18 BY C. JESSUP, MLT. Performed at [facility], [place] [address] [place],  [address]    Report Status PENDING   Incomplete   Organism ID, Bacteria SALMONELLA SPECIES  Final      Susceptibility   Salmonella species - MIC*    AMPICILLIN <=2 SENSITIVE Sensitive     LEVOFLOXACIN <=0.12 SENSITIVE Sensitive     TRIMETH/SULFA <=20 SENSITIVE Sensitive     * SALMONELLA SPECIES  Blood Culture ID Panel (Reflexed)     Status: Abnormal   Collection Time: 06/24/18  8:12 PM  Result Value Ref Range Status   Enterococcus species NOT DETECTED NOT DETECTED Final   Listeria monocytogenes NOT DETECTED NOT DETECTED Final   Staphylococcus species NOT DETECTED NOT DETECTED Final   Staphylococcus aureus NOT DETECTED NOT DETECTED Final   Streptococcus species NOT DETECTED NOT DETECTED Final   Streptococcus agalactiae NOT DETECTED NOT DETECTED Final   Streptococcus pneumoniae NOT DETECTED NOT DETECTED Final   Streptococcus pyogenes NOT DETECTED NOT DETECTED Final   Acinetobacter baumannii NOT DETECTED NOT DETECTED Final   Enterobacteriaceae species DETECTED (A) NOT DETECTED Final    Comment: Enterobacteriaceae represent a large family of gram negative bacteria, not a single organism. Refer to culture for further identification. CRITICAL RESULT CALLED TO, READ BACK BY AND VERIFIED WITH: DAVID BESANTI ON 06/26/18 AT 0115 BY JAG    Enterobacter cloacae complex NOT DETECTED NOT DETECTED Final   Escherichia coli NOT DETECTED NOT DETECTED Final   Klebsiella oxytoca NOT DETECTED NOT DETECTED Final   Klebsiella pneumoniae NOT DETECTED NOT DETECTED Final   Proteus species NOT DETECTED NOT DETECTED Final   Serratia marcescens NOT DETECTED NOT DETECTED Final   Carbapenem resistance NOT DETECTED NOT DETECTED Final   Haemophilus influenzae NOT DETECTED NOT DETECTED Final   Neisseria meningitidis NOT DETECTED NOT DETECTED Final   Pseudomonas aeruginosa NOT DETECTED NOT DETECTED Final   Candida albicans NOT DETECTED NOT DETECTED Final   Candida glabrata NOT DETECTED NOT DETECTED Final  Candida krusei NOT DETECTED NOT DETECTED  Final   Candida parapsilosis NOT DETECTED NOT DETECTED Final   Candida tropicalis NOT DETECTED NOT DETECTED Final    Comment: Performed at [facility], [address] [place], [place] [address]  C difficile quick scan w PCR reflex     Status: None   Collection Time: 06/24/18 10:16 PM  Result Value Ref Range Status   C Diff antigen NEGATIVE NEGATIVE Final   C Diff toxin NEGATIVE NEGATIVE Final   C Diff interpretation No C. difficile detected.  Final    Comment: Performed at [facility], [address] [place], [place] [address]  Gastrointestinal Panel by PCR , Stool     Status: Abnormal   Collection Time: 06/24/18 10:16 PM  Result Value Ref Range Status   Campylobacter species NOT DETECTED NOT DETECTED Final   Plesimonas shigelloides NOT DETECTED NOT DETECTED Final   Salmonella species DETECTED (A) NOT DETECTED Final    Comment: RESULT CALLED TO, READ BACK BY AND VERIFIED WITH: JENNIFER DALEY ON 06/25/18 AT 0001 BY JAG    Yersinia enterocolitica NOT DETECTED NOT DETECTED Final   Vibrio species NOT DETECTED NOT DETECTED Final   Vibrio cholerae NOT DETECTED NOT DETECTED Final   Enteroaggregative E coli (EAEC) NOT DETECTED NOT DETECTED Final   Enteropathogenic E coli (EPEC) NOT DETECTED NOT DETECTED Final   Enterotoxigenic E coli (ETEC) NOT DETECTED NOT DETECTED Final   Shiga like toxin producing E coli (STEC) NOT DETECTED NOT DETECTED Final   Shigella/Enteroinvasive E coli (EIEC) NOT DETECTED NOT DETECTED Final   Cryptosporidium NOT DETECTED NOT DETECTED Final   Cyclospora cayetanensis NOT DETECTED NOT DETECTED Final   Entamoeba histolytica NOT DETECTED NOT DETECTED Final   Giardia lamblia NOT DETECTED NOT DETECTED Final   Adenovirus F40/41 NOT DETECTED NOT DETECTED Final   Astrovirus NOT DETECTED NOT DETECTED Final   Norovirus GI/GII NOT DETECTED NOT DETECTED Final   Rotavirus A NOT DETECTED NOT DETECTED Final   Sapovirus (I, II, IV, and V) NOT DETECTED NOT  DETECTED Final    Comment: Performed at [facility], [address] [place], [place] [address]    RADIOLOGY:  No results found.   Management plans discussed with the patient, family and they are in agreement.  CODE STATUS:     Code Status Orders  (From admission, onward)        Start     Ordered   06/25/18 0220  Full code  Continuous     06/25/18 0220    Code Status History    This patient has a current code status but no historical code status.      TOTAL TIME TAKING CARE OF THIS PATIENT: *40* minutes.    Fritzi Mandes M.D on 06/29/2018 at 10:29 AM  Between 7am to 6pm - Pager - [phone] After 6pm go to [email or web address] - password [email or web address] [place] [facility]  Office  [phone]  CC: Primary care physician; Charles Burrow, MD

## 2018-07-11 LAB — CULTURE, BLOOD (ROUTINE X 2): SPECIAL REQUESTS: ADEQUATE

## 2018-07-31 DIAGNOSIS — M25531 Pain in right wrist: Secondary | ICD-10-CM | POA: Insufficient documentation

## 2018-08-21 DIAGNOSIS — Z96651 Presence of right artificial knee joint: Secondary | ICD-10-CM | POA: Insufficient documentation

## 2018-08-22 ENCOUNTER — Encounter: Payer: Self-pay | Admitting: Urology

## 2018-08-22 ENCOUNTER — Ambulatory Visit: Payer: Medicare Other | Admitting: Urology

## 2018-08-22 ENCOUNTER — Other Ambulatory Visit: Payer: Self-pay

## 2018-08-22 VITALS — BP 130/72 | HR 99 | Ht 65.0 in | Wt 189.9 lb

## 2018-08-22 DIAGNOSIS — R350 Frequency of micturition: Secondary | ICD-10-CM

## 2018-08-22 DIAGNOSIS — C61 Malignant neoplasm of prostate: Secondary | ICD-10-CM

## 2018-08-22 LAB — URINALYSIS, COMPLETE
Bilirubin, UA: NEGATIVE
Glucose, UA: NEGATIVE
Ketones, UA: NEGATIVE
Leukocytes, UA: NEGATIVE
Nitrite, UA: NEGATIVE
PH UA: 6 (ref 5.0–7.5)
Specific Gravity, UA: 1.025 (ref 1.005–1.030)
UUROB: 1 mg/dL (ref 0.2–1.0)

## 2018-08-22 LAB — MICROSCOPIC EXAMINATION
EPITHELIAL CELLS (NON RENAL): NONE SEEN /HPF (ref 0–10)
WBC UA: NONE SEEN /HPF (ref 0–5)

## 2018-08-22 MED ORDER — OXYBUTYNIN CHLORIDE ER 10 MG PO TB24: 10.0000 mg | ORAL_TABLET | Freq: Every day | ORAL | 6 refills | Status: DC

## 2018-08-22 NOTE — Progress Notes (Signed)
08/22/2018 3:56 PM   [name] 04-Sep-1946 [id number]  Referring provider: Theotis Burrow, MD [address] [place], [place] [address]  CC: Urinary frequency  HPI: I had the pleasure of seeing Mr. Latoya Diskin in urology clinic today for urinary frequency.  Today's visit was conducted via a Patent attorney. He is a 72 year old male status post recent right knee surgery who reports severe urinary frequency every 30 minutes to 1 hour during the day, as well as 6-12 times overnight.  The symptoms have been going on for at least 6 months, however he feels they are much worse after his knee surgery.  Denies any urinary incontinence.  He feels he does have a good stream and empties completely.  He denies any dysuria or gross hematuria.  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  Last PSA check was 0.3 in June 2017.  He has been on Flomax since that time.  He drinks mostly water during the day.  No significant smoking history.  PVR is 0 today in clinic   PMH: Past Medical History:  Diagnosis Date  . Diabetes mellitus without complication ([place])   . Prostate cancer [facility])     Surgical History: Past Surgical History:  Procedure Laterality Date  . HERNIA REPAIR    . prostate cancer       Allergies: No Known Allergies  Family History: Family History  Problem Relation Age of Onset  . Diabetes Mother   . Cancer Father     Social History:  reports that he has never smoked. He has never used smokeless tobacco. He reports that he drinks alcohol. He reports that he does not use drugs.  ROS: Please see flowsheet from today's date for complete review of systems.  Physical Exam: BP 130/72 (BP Location: Left Arm, Patient Position: Sitting, Cuff Size: Normal)   Pulse 99   Ht 5\' 5"  (1.651 m)   Wt 189 lb 14.4 oz (86.1 kg)    BMI 31.60 kg/m    Constitutional:  Alert and oriented, No acute distress. Cardiovascular: No clubbing, cyanosis, or edema. Respiratory: Normal respiratory effort, no increased work of breathing. GI: Abdomen is soft, nontender, nondistended, no abdominal masses GU: No CVA tenderness, phallus without lesions, widely patent meatus Lymph: No cervical or inguinal lymphadenopathy. Skin: Bandage over right knee Neurologic: Grossly intact, no focal deficits, moving all 4 extremities. Psychiatric: Normal mood and affect.  Laboratory Data: Urinalysis today with 0 WBCs, 3-10 RBCs, few bacteria, nitrite negative  Assessment & Plan:   In summary, Mr. Patsey Berthold can know is a 72 year old Spanish-speaking male with history of intermediate risk prostate cancer treated with radiation in 2013 who presents for evaluation of severe urinary frequency every 1 hour in the day and night.  We discussed behavioral strategies including minimizing caffeine, soda, carbonated beverages, as well as minimizing fluids overnight and voiding twice just prior to bed.  He also has microscopic hematuria and I recommended completing work-up with a CT urogram and a cystoscopy.  Cystoscopy will also rule out any stricture disease after radiation.  His PVR is 0 in clinic today and we will trial Ditropan XL daily to see if this improves his overactive symptoms.  He should have yearly PSAs to monitor for prostate cancer recurrence.   Return for Next cysto, CTU prior.  Trial of ditropan XL  Herbert Seta  [facility] [place], [place] [address], [place], [place] [address] [phone]

## 2018-08-22 NOTE — Patient Instructions (Signed)
Cistoscopia Cystoscopy La cistoscopia es un procedimiento que se utiliza para ayudar a Retail buyer y, a Clinical cytogeneticist, tratar afecciones que afectan las vas urinarias inferiores. Las vas urinarias inferiores estn conformadas por la vejiga y el tubo que drena la orina de la vejiga del cuerpo (uretra). La cistoscopia se realiza con un instrumento fino en forma de tubo con Ardelia Mems luz y una cmara en el extremo (cistoscopio). El cistoscopio puede ser duro (rgido) o flexible, segn el objetivo del procedimiento.El cistoscopio se introduce en la vejiga a travs de Geologist, engineering. La cistoscopia se puede recomendar en los siguientes casos:  Infecciones de las vas urinarias que se repiten (recurrentes).  Sangre en la orina (hematuria).  Prdida del control de la vejiga (incontinencia urinaria) o vejiga hiperactiva.  Clulas inusuales que se encuentran en Truddie Coco de [place].  Una obstruccin en la uretra.  Dolor al [place].  Una anomala en la vejiga que se encuentra durante un pielografa intravenosa (PIV) o exploracin por tomografa computarizada (TC) .  La cistoscopia tambin puede realizarse para extraer Truddie Coco de tejido para examinarla con un microscopio (biopsia). Informe al mdico acerca de lo siguiente:  Cualquier alergia que tenga.  Todos los [employer], incluidos vitaminas, hierbas, gotas oftlmicas, cremas y medicamentos de venta libre.  Cualquier problema previo que usted o los miembros de su familia hayan tenido con anestsicos.  Enfermedades de la sangre que tenga.  Cirugas previas.  Cualquier enfermedad que tenga.  Si est embarazada o podra estarlo. Cules son los riesgos? En general, se trata de un procedimiento seguro. Sin embargo, pueden ocurrir complicaciones, por ejemplo:  Una infeccin.  Hemorragia.  Reacciones alrgicas a los medicamentos.  Daos a Catering manager u otros rganos.  Qu ocurre antes del procedimiento?  Consulte al mdico  si debe hacer o no lo siguiente: ? Cambiar o suspender los medicamentos que toma habitualmente. Esto es muy importante si toma medicamentos para la diabetes o anticoagulantes. ? Tomar medicamentos como aspirina e ibuprofeno. Estos medicamentos pueden tener un efecto anticoagulante en la [place]. No tome estos medicamentos antes del procedimiento si el mdico le indica que no lo haga.  Siga las indicaciones del mdico respecto de las restricciones para las comidas o las bebidas.  Pueden darle antibiticos para ayudar a prevenir las infecciones.  Es posible que deba someterse a un examen o a una prueba, como radiografas de la vejiga, la uretra o los riones.  Tambin puede ser [employer] hagan un anlisis de orina para determinar si tiene signos de infeccin.  Haga planes para que una persona lo lleve de vuelta a su casa despus del procedimiento. Qu ocurre durante el procedimiento?  Para reducir el riesgo de infeccin, el equipo mdico se lavar o se desinfectar las manos.  Le administrarn uno o ms de los siguientes medicamentos: ? Un medicamento para ayudarlo a relajarse (sedante). ? Un medicamento para adormecer la zona (anestesia local).  Se limpiar el rea que se encuentra alrededor de la abertura de la uretra.  El cistoscopio se introducir por la uretra e ingresar a la vejiga.  Un lquido estril fluir por el cistoscopio y Recruitment consultant vejiga. El lquido Building services engineer vejiga para que el cirujano pueda examinar claramente las paredes de la vejiga.  El cistoscopio se retirar y Hydrographic surveyor. Este procedimiento puede variar segn el mdico y el hospital. Sander Nephew ocurre despus del procedimiento?  Es posible que sienta dolor o molestias en el abdomen o la uretra. Le ofrecern medicamentos  para ayudarlo.  Es posible que observe [employer].  No conduzca durante 24horas si le administraron un sedante. Esta informacin no tiene Marine scientist el consejo  del mdico. Asegrese de hacerle al mdico cualquier pregunta que tenga. Document Released: 12/10/2005 Document Revised: 03/14/2017 Document Reviewed: 10/27/2015 Elsevier Interactive Patient Education  [employer].

## 2018-08-25 LAB — CULTURE, URINE COMPREHENSIVE

## 2018-09-19 ENCOUNTER — Ambulatory Visit
Admission: RE | Admit: 2018-09-19 | Discharge: 2018-09-19 | Disposition: A | Discharge status: Home / Self Care | Payer: Medicare Other | Source: Ambulatory Visit | Attending: Urology | Admitting: Urology

## 2018-09-19 DIAGNOSIS — R3129 Other microscopic hematuria: Secondary | ICD-10-CM | POA: Insufficient documentation

## 2018-09-19 DIAGNOSIS — R350 Frequency of micturition: Secondary | ICD-10-CM | POA: Diagnosis not present

## 2018-09-19 DIAGNOSIS — N281 Cyst of kidney, acquired: Secondary | ICD-10-CM | POA: Insufficient documentation

## 2018-09-19 DIAGNOSIS — I898 Other specified noninfective disorders of lymphatic vessels and lymph nodes: Secondary | ICD-10-CM | POA: Diagnosis not present

## 2018-09-19 LAB — POCT I-STAT CREATININE: Creatinine, Ser: 0.7 mg/dL (ref 0.61–1.24)

## 2018-09-19 MED ORDER — IOPAMIDOL (ISOVUE-300) INJECTION 61%
125.0000 mL | Freq: Once | INTRAVENOUS | Status: AC | PRN
  Administered 2018-09-19: 125 mL via INTRAVENOUS

## 2018-09-23 ENCOUNTER — Ambulatory Visit: Payer: Medicare Other | Admitting: Urology

## 2018-09-23 ENCOUNTER — Encounter: Payer: Self-pay | Admitting: Urology

## 2018-09-23 VITALS — BP 125/74 | HR 61 | Resp 16 | Ht 65.0 in | Wt 186.7 lb

## 2018-09-23 DIAGNOSIS — C61 Malignant neoplasm of prostate: Secondary | ICD-10-CM

## 2018-09-23 DIAGNOSIS — R350 Frequency of micturition: Secondary | ICD-10-CM | POA: Diagnosis not present

## 2018-09-23 LAB — URINALYSIS, COMPLETE
BILIRUBIN UA: NEGATIVE
Glucose, UA: NEGATIVE
KETONES UA: NEGATIVE
Leukocytes, UA: NEGATIVE
NITRITE UA: NEGATIVE
PH UA: 5.5 (ref 5.0–7.5)
Protein, UA: NEGATIVE
Specific Gravity, UA: 1.02 (ref 1.005–1.030)
UUROB: 0.2 mg/dL (ref 0.2–1.0)

## 2018-09-23 LAB — MICROSCOPIC EXAMINATION
Epithelial Cells (non renal): NONE SEEN /hpf (ref 0–10)
WBC UA: NONE SEEN /HPF (ref 0–5)

## 2018-09-23 NOTE — Progress Notes (Signed)
Cystoscopy Procedure Note:  Indication: Urinary frequency, history brachytherapy for PCa, r/o stricture  After informed consent and discussion of the procedure and its risks, Davy Westmoreland was positioned and prepped in the standard fashion. Cystoscopy was performed with a flexible cystoscope. The urethra, bladder neck and entire bladder was visualized in a standard fashion. The urethra was grossly normal. The prostate was short and atrophic consistent with radiation. The ureteral orifices were visualized in their normal location and orientation. No concerning findings on retroflexion.  Imaging:  CT urogram with no abnormalities  Findings: Normal cystoscopy  Assessment and Plan: -He has had significant improvement in OAB symptoms on ditropan XL, continue this medication.  -RTC one year with PSA prior  Nickolas Madrid, MD 09/23/2018

## 2019-02-06 ENCOUNTER — Other Ambulatory Visit: Payer: Self-pay | Admitting: Family Medicine

## 2019-02-06 DIAGNOSIS — M542 Cervicalgia: Secondary | ICD-10-CM

## 2019-09-07 ENCOUNTER — Encounter: Payer: Self-pay | Admitting: Family Medicine

## 2019-09-17 ENCOUNTER — Telehealth: Payer: Self-pay

## 2019-09-17 ENCOUNTER — Other Ambulatory Visit: Payer: Self-pay

## 2019-09-17 ENCOUNTER — Other Ambulatory Visit: Payer: Medicare Other

## 2019-09-17 DIAGNOSIS — Z1211 Encounter for screening for malignant neoplasm of colon: Secondary | ICD-10-CM

## 2019-09-17 DIAGNOSIS — R350 Frequency of micturition: Secondary | ICD-10-CM

## 2019-09-17 NOTE — Telephone Encounter (Signed)
Gastroenterology Pre-Procedure Review  Request Date: 09/29/19 Requesting Physician: Dr. Allen Norris  PATIENT REVIEW QUESTIONS: The patient responded to the following health history questions as indicated:    1. Are you having any GI issues? no 2. Do you have a personal history of Polyps? no 3. Do you have a family history of Colon Cancer or Polyps? no 4. Diabetes Mellitus? Yes oral meds 5. Joint replacements in the past 12 months?no 6. Major health problems in the past 3 months?no 7. Any artificial heart valves, MVP, or defibrillator?no    MEDICATIONS & ALLERGIES:    Patient reports the following regarding taking any anticoagulation/antiplatelet therapy:   Plavix, Coumadin, Eliquis, Xarelto, Lovenox, Pradaxa, Brilinta, or Effient? yes (Eliquis blood thinner request sent to PCP) Aspirin? yes (81 mg)  Patient confirms/reports the following medications:  Current Outpatient Medications  Medication Sig Dispense Refill  . apixaban (ELIQUIS) 2.5 MG TABS tablet Take by mouth.    Marland Kitchen aspirin EC 81 MG tablet Take 81 mg by mouth daily.    Marland Kitchen atorvastatin (LIPITOR) 80 MG tablet Take 80 mg by mouth daily.     Marland Kitchen lisinopril (PRINIVIL,ZESTRIL) 2.5 MG tablet Take 2.5 mg by mouth daily.    . metFORMIN (GLUCOPHAGE-XR) 500 MG 24 hr tablet Take 1,000 mg by mouth 2 (two) times daily.    Marland Kitchen oxybutynin (DITROPAN-XL) 10 MG 24 hr tablet Take 1 tablet (10 mg total) by mouth daily. (Patient not taking: Reported on 09/23/2018) 30 tablet 6  . oxyCODONE (OXY IR/ROXICODONE) 5 MG immediate release tablet Take by mouth.    . tamsulosin (FLOMAX) 0.4 MG CAPS capsule Take 0.4 mg by mouth daily.     No current facility-administered medications for this visit.     Patient confirms/reports the following allergies:  No Known Allergies  No orders of the defined types were placed in this encounter.   AUTHORIZATION INFORMATION Primary Insurance: 1D#: Group #:  Secondary Insurance: 1D#: Group #:  SCHEDULE  INFORMATION: Date: 09/29/19 Time: Location:

## 2019-09-18 LAB — PSA: Prostate Specific Ag, Serum: <0.1 ng/mL (ref 0.0–4.0)

## 2019-09-24 ENCOUNTER — Encounter: Payer: Self-pay | Admitting: Urology

## 2019-09-24 ENCOUNTER — Other Ambulatory Visit: Payer: Self-pay

## 2019-09-24 ENCOUNTER — Ambulatory Visit: Payer: Medicare Other | Admitting: Urology

## 2019-09-24 VITALS — BP 128/77 | HR 63 | Ht 67.0 in | Wt 193.0 lb

## 2019-09-24 DIAGNOSIS — C61 Malignant neoplasm of prostate: Secondary | ICD-10-CM | POA: Diagnosis not present

## 2019-09-24 DIAGNOSIS — N3281 Overactive bladder: Secondary | ICD-10-CM | POA: Diagnosis not present

## 2019-09-24 MED ORDER — OXYBUTYNIN CHLORIDE ER 15 MG PO TB24: 15.0000 mg | ORAL_TABLET | Freq: Every day | ORAL | 11 refills | Status: AC

## 2019-09-24 NOTE — Progress Notes (Signed)
   09/24/2019 2:33 PM   Rachid Hlavaty [birth year] PI:[id number]  Reason for visit: Follow up prostate cancer, OAB  HPI: I saw Mr. Charles Cooke back in urology clinic today for follow-up.  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  His pretreatment PSA was 5.7.  PSA in September 2020 is undetectable.  I originally saw him in August 2019 when he was having severe urinary frequency and nocturia.  We performed cystoscopy to rule out a urethral stricture or other pathology which was negative and showed an atrophic prostate consistent with prior radiation treatment.  We started him on oxybutynin XL 10 mg which is significantly improved his urinary symptoms.  He has also been on Flomax long-term since radiation.  He reports urinary frequency every 2-3 hours during the day and nocturia 4 times at night.  If he minimizes fluids after 5 PM then he will only get up twice at night.  He does not drink any sodas or tea.  I recommended increasing the oxybutynin to 15 mg daily, and we re-discussed behavioral strategies of double voiding prior to bed and minimizing fluids in the evening.  His history of radiation is likely the primary etiology behind his urinary symptoms.  Oxybutynin increased to 15 mg XL daily RTC 1 year with PSA prior  A total of 15 minutes were spent face-to-face with the patient, greater than 50% was spent in patient education, counseling, and coordination of care regarding history of prostate cancer and OAB symptoms.  Billey Co, [facility] [address] [place], [place] [address] [phone]

## 2019-09-25 ENCOUNTER — Other Ambulatory Visit
Admission: RE | Admit: 2019-09-25 | Discharge: 2019-09-25 | Disposition: A | Discharge status: Home / Self Care | Payer: Medicare Other | Source: Ambulatory Visit | Attending: Gastroenterology | Admitting: Gastroenterology

## 2019-09-25 DIAGNOSIS — Z20828 Contact with and (suspected) exposure to other viral communicable diseases: Secondary | ICD-10-CM | POA: Insufficient documentation

## 2019-09-25 DIAGNOSIS — Z01812 Encounter for preprocedural laboratory examination: Secondary | ICD-10-CM | POA: Diagnosis present

## 2019-09-25 LAB — SARS CORONAVIRUS 2 (TAT 6-24 HRS): SARS Coronavirus 2: NEGATIVE

## 2019-09-28 ENCOUNTER — Encounter: Payer: Self-pay | Admitting: Emergency Medicine

## 2019-09-29 ENCOUNTER — Ambulatory Visit
Admission: RE | Admit: 2019-09-29 | Discharge: 2019-09-29 | Disposition: A | Discharge status: Home / Self Care | Payer: Medicare Other | Attending: Gastroenterology | Admitting: Gastroenterology

## 2019-09-29 ENCOUNTER — Encounter
Admission: RE | Disposition: A | Discharge status: Home / Self Care | Payer: Self-pay | Source: Home / Self Care | Attending: Gastroenterology

## 2019-09-29 ENCOUNTER — Encounter: Payer: Self-pay | Admitting: Anesthesiology

## 2019-09-29 DIAGNOSIS — Z539 Procedure and treatment not carried out, unspecified reason: Secondary | ICD-10-CM | POA: Insufficient documentation

## 2019-09-29 DIAGNOSIS — Z1211 Encounter for screening for malignant neoplasm of colon: Secondary | ICD-10-CM | POA: Insufficient documentation

## 2019-09-29 SURGERY — COLONOSCOPY WITH PROPOFOL
Anesthesia: General

## 2019-09-29 MED ORDER — SODIUM CHLORIDE 0.9 % IV SOLN: INTRAVENOUS | Status: DC

## 2019-09-29 NOTE — Anesthesia Preprocedure Evaluation (Deleted)
Anesthesia Evaluation  Patient identified by MRN, date of birth, ID band Patient awake    Reviewed: Allergy & Precautions, H&P , NPO status , Patient's Chart, lab work & pertinent test results  History of Anesthesia Complications Negative for: history of anesthetic complications  Airway Mallampati: III  TM Distance: <3 FB Neck ROM: limited    Dental  (+) Chipped, Poor Dentition, Missing   Pulmonary neg pulmonary ROS, neg shortness of breath,           Cardiovascular Exercise Tolerance: Good hypertension, (-) angina(-) Past MI and (-) DOE      Neuro/Psych negative neurological ROS  negative psych ROS   GI/Hepatic negative GI ROS, Neg liver ROS, neg GERD  ,  Endo/Other  diabetes, Type 2  Renal/GU Renal disease  negative genitourinary   Musculoskeletal  (+) Arthritis ,   Abdominal   Peds  Hematology negative hematology ROS (+)   Anesthesia Other Findings Past Medical History: No date: Diabetes mellitus without complication (HCC) No date: Prostate cancer ([place])  Past Surgical History: No date: HERNIA REPAIR No date: prostate cancer  BMI    Body Mass Index: 30.23 kg/m      Reproductive/Obstetrics negative OB ROS                             Anesthesia Physical Anesthesia Plan  ASA: III  Anesthesia Plan: General   Post-op Pain Management:    Induction: Intravenous  PONV Risk Score and Plan: Propofol infusion and TIVA  Airway Management Planned: Natural Airway and Nasal Cannula  Additional Equipment:   Intra-op Plan:   Post-operative Plan:   Informed Consent: I have reviewed the patients History and Physical, chart, labs and discussed the procedure including the risks, benefits and alternatives for the proposed anesthesia with the patient or authorized representative who has indicated his/her understanding and acceptance.     Dental Advisory Given  Plan Discussed  with: Anesthesiologist, CRNA and Surgeon  Anesthesia Plan Comments: (Consent via interpreter   Patient consented for risks of anesthesia including but not limited to:  - adverse reactions to medications - risk of intubation if required - damage to teeth, lips or other oral mucosa - sore throat or hoarseness - Damage to heart, brain, lungs or loss of life  Patient voiced understanding.)        Anesthesia Quick Evaluation

## 2020-07-13 ENCOUNTER — Encounter: Payer: Self-pay | Admitting: Urology

## 2020-08-15 ENCOUNTER — Other Ambulatory Visit: Payer: Self-pay | Admitting: Orthopedic Surgery

## 2020-08-17 ENCOUNTER — Other Ambulatory Visit: Payer: Self-pay

## 2020-08-17 ENCOUNTER — Encounter
Admission: RE | Admit: 2020-08-17 | Discharge: 2020-08-17 | Disposition: A | Discharge status: Home / Self Care | Payer: Medicare Other | Source: Ambulatory Visit | Attending: Orthopedic Surgery | Admitting: Orthopedic Surgery

## 2020-08-17 HISTORY — DX: Essential (primary) hypertension: I10

## 2020-08-17 NOTE — Patient Instructions (Signed)
Your procedure is scheduled on: Friday August 19, 2020. Su procedimiento est programado para: Viernes 27 de Agosto del 2021. Report to Day Surgery inside [place] 2nd floor. Presntese a: Science writer del [facility] 2do piso.  To find out your arrival time please call [phone] between 1PM - 3PM on Thursday August 18, 2020. Para saber su hora de llegada por favor llame al [phone] Lyndal Pulley la 1PM - 3PM el da: Rudi Coco 26 de Agosto del 2021.  Remember: Instructions that are not followed completely may result in serious medical risk, up to and including death,  or upon the discretion of your surgeon and anesthesiologist your surgery may need to be rescheduled.  Recuerde: Las instrucciones que no se siguen completamente Heritage manager en un riesgo de salud grave, incluyendo hasta  la [place] o a discrecin de su cirujano y Environmental health practitioner, su ciruga se puede posponer.   __X_ 1.Do not eat food after midnight the night before your procedure. No    gum chewing or hard candies. You may drink clear liquids up to 2 hours     before you are scheduled to arrive for your surgery- DO not drink clear     Liquids within 2 hours of the start of your surgery.     Clear Liquids include:    water, apple juice without pulp, clear carbohydrate drink such as    Clearfast of [place]ade, Black Coffee or Tea (Do not add anything to coffee or tea).      No coma nada despus de la medianoche de la noche anterior a su    procedimiento. No coma chicles ni caramelos duros. Puede tomar    lquidos claros hasta 2 horas antes de su hora programada de llegada al     hospital para su procedimiento. No tome lquidos claros durante el     transcurso de las 2 horas de su llegada programada al hospital para su     procedimiento, ya que esto puede llevar a que su procedimiento se    retrase o tenga que volver a Health and safety inspector.  Los lquidos claros incluyen:          - Agua o jugo de [place] sin pulpa           - Bebidas claras con carbohidratos como ClearFast o Gatorade          - Caf negro o t claro (sin leche, sin cremas, no agregue nada al caf ni al t)  No tome nada que no est en esta lista.  Los pacientes con diabetes tipo 1 y tipo 2 solo deben Agricultural engineer.  Llame a la clnica de PreCare o a la unidad de Same Day Surgery si  tiene alguna pregunta sobre estas instrucciones.               _X__ 2. Complete la "Bebida clara de carbohidratos claros antes de la ciruga" que se le proporcion, 2 horas antes de la llegada. ** Si es   diabtico, se le proporcionar una bebida alternativa, Gatorade Zero o G            __X__ 3. Do Not Smoke or use e-cigarettes For 24 Hours Prior to Your Surgery.    Do not use any chewable tobacco products for at least 6   hours prior to surgery.    No fume ni use cigarrillos electrnicos durante las 24 horas previas    a su [place].  No use ningn producto de tabaco [employer]  al menos 6 horas antes de la [place].     __X_ 4. No alcohol for 24 hours before or after surgery.    No tome alcohol durante las 24 horas antes ni despus de la [place].   __X__5. Brush your teeth with toothpaste and water, make sure not to swallow any toothpaste or mouthwash.    Cepillese los dientes con pasta de dientes, asegurese de no tragar la pasta de dientes o enjuague bucal.     __X__ 6. Notify your doctor if there is any change in your medical condition (cold,fever, infections).    Informe a su mdico si hay algn cambio en su condicin mdica  (resfriado, fiebre, infecciones).   Do not wear jewelry, make-up, hairpins, clips or nail polish.  No use joyas, maquillajes, pinzas/ganchos para el cabello ni esmalte de uas.  Do not wear lotions, powders, or perfumes. You may wear deodorant.  No use lociones, polvos o perfumes.  Puede usar desodorante.    Do not shave 48 hours prior to surgery. Men may shave face and neck.  No se afeite 48 horas antes de la [place].   Los hombres pueden [employer] cara  y el cuello.   Do not bring valuables to the hospital.   No lleve objetos [facility] is not responsible for any belongings or valuables.  [place] no se hace responsable de ningn tipo de pertenencias u objetos de Geographical information systems officer.               Contacts, dentures or bridgework may not be worn into surgery.  Los lentes de [place], las dentaduras postizas o puentes no se pueden usar en la [place].   Leave your suitcase in the car. After surgery it may be brought to your room.  Deje su maleta en el auto.  Despus de la ciruga podr traerla a su habitacin.   For patients admitted to the hospital, discharge time is determined by your  treatment team.  Para los pacientes que sean ingresados al hospital, el tiempo en el cual se le  dar de alta es determinado por su equipo de [place].   Patients discharged the day of surgery will not be allowed to drive home. A los pacientes que se les da de alta el mismo da de la ciruga no se les permitir conducir a [place] representative.   Please read over the following fact sheets that you were given: Por favor [place] hojas de informacin que le dieron:   See attached forms for Incentive spirometry and soap information    __X__ Take these medicines the morning of surgery with A SIP OF WATER:          [employer] medicinas la maana de la ciruga con UN SORBO DE AGUA:   1.oxybutynin (DITROPAN XL) 15 MG   2. tamsulosin (FLOMAX) 0.4 MG   3.HYDROcodone-acetaminophen (NORCO/VICODIN) 5-325 (si lo necesita)    __X__ Use CHG Soap as directed          Utilice el jabn de CHG segn lo indicado  __X__ Stop metformin 2 days prior to surgery          Deje de tomar el metformin 2 das antes de la ciruga     __X__ Stop Anti-inflammatories such as Ibuprofen, Aleve, Advil, naproxen and or BC powders.           Deje de tomar antiinflamatorios como Ibuprofen, Aleve, Advil, naproxen o polvos de BC  powder.  __X__ Stop supplements until after surgery            Deje de tomar suplementos hasta despus de la ciruga  __X__ Do not start any herbal supplements before your surgery.           No empieze a tomar supplementos de hierbas antes de su cirugia.    If you have any questions regarding your pre-procedure instructions,  Please call Pre-admit Testing at [phone].

## 2020-08-18 ENCOUNTER — Other Ambulatory Visit
Admission: RE | Admit: 2020-08-18 | Discharge: 2020-08-18 | Disposition: A | Discharge status: Home / Self Care | Payer: Medicare Other | Source: Ambulatory Visit | Attending: Orthopedic Surgery | Admitting: Orthopedic Surgery

## 2020-08-18 DIAGNOSIS — Z20822 Contact with and (suspected) exposure to covid-19: Secondary | ICD-10-CM | POA: Diagnosis not present

## 2020-08-18 DIAGNOSIS — Z01818 Encounter for other preprocedural examination: Secondary | ICD-10-CM | POA: Diagnosis present

## 2020-08-18 DIAGNOSIS — I10 Essential (primary) hypertension: Secondary | ICD-10-CM | POA: Insufficient documentation

## 2020-08-18 LAB — BASIC METABOLIC PANEL
Anion gap: 10 (ref 5–15)
BUN: 14 mg/dL (ref 8–23)
CO2: 25 mmol/L (ref 22–32)
Calcium: 8.9 mg/dL (ref 8.9–10.3)
Chloride: 102 mmol/L (ref 98–111)
Creatinine, Ser: 0.69 mg/dL (ref 0.61–1.24)
GFR calc Af Amer: >60 mL/min (ref >60)
GFR calc non Af Amer: >60 mL/min (ref >60)
Glucose, Bld: 137 mg/dL — ABNORMAL HIGH (ref 70–99)
Potassium: 4.3 mmol/L (ref 3.5–5.1)
Sodium: 137 mmol/L (ref 135–145)

## 2020-08-18 LAB — CBC
HCT: 40.8 % (ref 39.0–52.0)
Hemoglobin: 13.5 g/dL (ref 13.0–17.0)
MCH: 29.7 pg (ref 26.0–34.0)
MCHC: 33.1 g/dL (ref 30.0–36.0)
MCV: 89.9 fL (ref 80.0–100.0)
Platelets: 230 10*3/uL (ref 150–400)
RBC: 4.54 MIL/uL (ref 4.22–5.81)
RDW: 13 % (ref 11.5–15.5)
WBC: 6.5 10*3/uL (ref 4.0–10.5)
nRBC: 0 % (ref 0.0–0.2)

## 2020-08-18 LAB — SARS CORONAVIRUS 2 (TAT 6-24 HRS): SARS Coronavirus 2: NEGATIVE

## 2020-08-18 MED ORDER — LACTATED RINGERS IV SOLN: INTRAVENOUS | Status: DC

## 2020-08-19 ENCOUNTER — Ambulatory Visit: Payer: Medicare Other | Admitting: Certified Registered Nurse Anesthetist

## 2020-08-19 ENCOUNTER — Ambulatory Visit: Payer: Medicare Other

## 2020-08-19 ENCOUNTER — Ambulatory Visit
Admission: RE | Admit: 2020-08-19 | Discharge: 2020-08-19 | Disposition: A | Discharge status: Home / Self Care | Payer: Medicare Other | Source: Ambulatory Visit | Attending: Orthopedic Surgery | Admitting: Orthopedic Surgery

## 2020-08-19 ENCOUNTER — Encounter: Payer: Self-pay | Admitting: Orthopedic Surgery

## 2020-08-19 ENCOUNTER — Encounter
Admission: RE | Disposition: A | Discharge status: Home / Self Care | Payer: Self-pay | Source: Ambulatory Visit | Attending: Orthopedic Surgery

## 2020-08-19 ENCOUNTER — Other Ambulatory Visit: Payer: Self-pay

## 2020-08-19 DIAGNOSIS — S52571A Other intraarticular fracture of lower end of right radius, initial encounter for closed fracture: Secondary | ICD-10-CM | POA: Insufficient documentation

## 2020-08-19 DIAGNOSIS — Z9660 Presence of unspecified orthopedic joint implant: Secondary | ICD-10-CM | POA: Diagnosis not present

## 2020-08-19 DIAGNOSIS — I10 Essential (primary) hypertension: Secondary | ICD-10-CM | POA: Insufficient documentation

## 2020-08-19 DIAGNOSIS — M1990 Unspecified osteoarthritis, unspecified site: Secondary | ICD-10-CM | POA: Insufficient documentation

## 2020-08-19 DIAGNOSIS — E785 Hyperlipidemia, unspecified: Secondary | ICD-10-CM | POA: Insufficient documentation

## 2020-08-19 DIAGNOSIS — Z79899 Other long term (current) drug therapy: Secondary | ICD-10-CM | POA: Insufficient documentation

## 2020-08-19 DIAGNOSIS — W109XXA Fall (on) (from) unspecified stairs and steps, initial encounter: Secondary | ICD-10-CM | POA: Insufficient documentation

## 2020-08-19 DIAGNOSIS — Z419 Encounter for procedure for purposes other than remedying health state, unspecified: Secondary | ICD-10-CM

## 2020-08-19 DIAGNOSIS — E119 Type 2 diabetes mellitus without complications: Secondary | ICD-10-CM | POA: Diagnosis not present

## 2020-08-19 DIAGNOSIS — Z8546 Personal history of malignant neoplasm of prostate: Secondary | ICD-10-CM | POA: Diagnosis not present

## 2020-08-19 DIAGNOSIS — Z7984 Long term (current) use of oral hypoglycemic drugs: Secondary | ICD-10-CM | POA: Insufficient documentation

## 2020-08-19 DIAGNOSIS — Z8781 Personal history of (healed) traumatic fracture: Secondary | ICD-10-CM

## 2020-08-19 DIAGNOSIS — Z7982 Long term (current) use of aspirin: Secondary | ICD-10-CM | POA: Diagnosis not present

## 2020-08-19 DIAGNOSIS — Z809 Family history of malignant neoplasm, unspecified: Secondary | ICD-10-CM | POA: Insufficient documentation

## 2020-08-19 DIAGNOSIS — Z833 Family history of diabetes mellitus: Secondary | ICD-10-CM | POA: Diagnosis not present

## 2020-08-19 HISTORY — PX: ORIF WRIST FRACTURE: SHX2133

## 2020-08-19 LAB — GLUCOSE, CAPILLARY
Glucose-Capillary: 135 mg/dL — ABNORMAL HIGH (ref 70–99)
Glucose-Capillary: 130 mg/dL — ABNORMAL HIGH (ref 70–99)

## 2020-08-19 SURGERY — OPEN REDUCTION INTERNAL FIXATION (ORIF) WRIST FRACTURE
Anesthesia: General | Site: Wrist | Laterality: Right

## 2020-08-19 MED ORDER — CEFAZOLIN SODIUM-DEXTROSE 2-4 GM/100ML-% IV SOLN
INTRAVENOUS | Status: AC
  Filled 2020-08-19: qty 100

## 2020-08-19 MED ORDER — PHENYLEPHRINE HCL (PRESSORS) 10 MG/ML IV SOLN
INTRAVENOUS | Status: DC | PRN
  Administered 2020-08-19: 100 ug via INTRAVENOUS

## 2020-08-19 MED ORDER — ROPIVACAINE HCL 5 MG/ML IJ SOLN
INTRAMUSCULAR | Status: AC
  Filled 2020-08-19: qty 30

## 2020-08-19 MED ORDER — SODIUM CHLORIDE 0.9 % IV SOLN: INTRAVENOUS | Status: DC

## 2020-08-19 MED ORDER — PHENYLEPHRINE HCL (PRESSORS) 10 MG/ML IV SOLN
INTRAVENOUS | Status: AC
  Filled 2020-08-19: qty 1

## 2020-08-19 MED ORDER — PROPOFOL 10 MG/ML IV BOLUS
INTRAVENOUS | Status: AC
  Filled 2020-08-19: qty 40

## 2020-08-19 MED ORDER — HYDROCODONE-ACETAMINOPHEN 5-325 MG PO TABS
1.0000 | ORAL_TABLET | Freq: Four times a day (QID) | ORAL | 0 refills | Status: AC | PRN
Start: 2020-08-19 — End: ?

## 2020-08-19 MED ORDER — NEOMYCIN-POLYMYXIN B GU 40-200000 IR SOLN
Status: DC | PRN
  Administered 2020-08-19: 2 mL

## 2020-08-19 MED ORDER — PROPOFOL 10 MG/ML IV BOLUS
INTRAVENOUS | Status: AC
  Filled 2020-08-19: qty 20

## 2020-08-19 MED ORDER — CHLORHEXIDINE GLUCONATE 0.12 % MT SOLN: 15.0000 mL | Freq: Once | OROMUCOSAL | Status: AC

## 2020-08-19 MED ORDER — PROPOFOL 10 MG/ML IV BOLUS
INTRAVENOUS | Status: DC | PRN
  Administered 2020-08-19: 140 mg via INTRAVENOUS
  Administered 2020-08-19: 40 mg via INTRAVENOUS
  Administered 2020-08-19: 20 mg via INTRAVENOUS

## 2020-08-19 MED ORDER — ROPIVACAINE HCL 5 MG/ML IJ SOLN
INTRAMUSCULAR | Status: DC | PRN
  Administered 2020-08-19 (×4): 5 mL via EPIDURAL

## 2020-08-19 MED ORDER — FENTANYL CITRATE (PF) 100 MCG/2ML IJ SOLN
INTRAMUSCULAR | Status: AC
  Filled 2020-08-19: qty 2

## 2020-08-19 MED ORDER — LIDOCAINE HCL (CARDIAC) PF 100 MG/5ML IV SOSY
PREFILLED_SYRINGE | INTRAVENOUS | Status: DC | PRN
  Administered 2020-08-19: 80 mg via INTRAVENOUS

## 2020-08-19 MED ORDER — FENTANYL CITRATE (PF) 100 MCG/2ML IJ SOLN
INTRAMUSCULAR | Status: AC
Start: 2020-08-19 — End: 2020-08-19
  Administered 2020-08-19: 50 ug via INTRAVENOUS
  Filled 2020-08-19: qty 2

## 2020-08-19 MED ORDER — ONDANSETRON HCL 4 MG/2ML IJ SOLN
INTRAMUSCULAR | Status: DC | PRN
  Administered 2020-08-19: 4 mg via INTRAVENOUS

## 2020-08-19 MED ORDER — CEFAZOLIN SODIUM-DEXTROSE 2-4 GM/100ML-% IV SOLN
2.0000 g | INTRAVENOUS | Status: AC
  Administered 2020-08-19: 2 g via INTRAVENOUS

## 2020-08-19 MED ORDER — ORAL CARE MOUTH RINSE: 15.0000 mL | Freq: Once | OROMUCOSAL | Status: AC

## 2020-08-19 MED ORDER — FENTANYL CITRATE (PF) 100 MCG/2ML IJ SOLN
INTRAMUSCULAR | Status: AC
Start: 2020-08-19 — End: 2020-08-19
  Administered 2020-08-19: 50 ug
  Filled 2020-08-19: qty 2

## 2020-08-19 MED ORDER — FENTANYL CITRATE (PF) 100 MCG/2ML IJ SOLN
INTRAMUSCULAR | Status: DC | PRN
Start: 2020-08-19 — End: 2020-08-19
  Administered 2020-08-19 (×2): 50 ug via INTRAVENOUS

## 2020-08-19 MED ORDER — ACETAMINOPHEN 10 MG/ML IV SOLN
INTRAVENOUS | Status: AC
  Filled 2020-08-19: qty 100

## 2020-08-19 MED ORDER — CHLORHEXIDINE GLUCONATE 0.12 % MT SOLN
OROMUCOSAL | Status: AC
  Administered 2020-08-19: 15 mL via OROMUCOSAL
  Filled 2020-08-19: qty 15

## 2020-08-19 MED ORDER — FAMOTIDINE 20 MG PO TABS: 20.0000 mg | ORAL_TABLET | Freq: Once | ORAL | Status: AC

## 2020-08-19 MED ORDER — DEXAMETHASONE SODIUM PHOSPHATE 10 MG/ML IJ SOLN
INTRAMUSCULAR | Status: DC | PRN
  Administered 2020-08-19: 5 mg via INTRAVENOUS

## 2020-08-19 MED ORDER — ACETAMINOPHEN 10 MG/ML IV SOLN
INTRAVENOUS | Status: DC | PRN
  Administered 2020-08-19: 1000 mg via INTRAVENOUS

## 2020-08-19 MED ORDER — FENTANYL CITRATE (PF) 100 MCG/2ML IJ SOLN
25.0000 ug | INTRAMUSCULAR | Status: DC | PRN
  Administered 2020-08-19: 50 ug via INTRAVENOUS

## 2020-08-19 MED ORDER — OXYCODONE HCL 5 MG/5ML PO SOLN: 5.0000 mg | Freq: Once | ORAL | Status: DC | PRN

## 2020-08-19 MED ORDER — ONDANSETRON HCL 4 MG/2ML IJ SOLN: 4.0000 mg | Freq: Once | INTRAMUSCULAR | Status: DC | PRN

## 2020-08-19 MED ORDER — OXYCODONE HCL 5 MG PO TABS: 5.0000 mg | ORAL_TABLET | Freq: Once | ORAL | Status: DC | PRN

## 2020-08-19 MED ORDER — LIDOCAINE HCL (PF) 1 % IJ SOLN
INTRAMUSCULAR | Status: AC
  Filled 2020-08-19: qty 5

## 2020-08-19 MED ORDER — ONDANSETRON HCL 4 MG/2ML IJ SOLN
INTRAMUSCULAR | Status: AC
  Filled 2020-08-19: qty 2

## 2020-08-19 MED ORDER — LIDOCAINE HCL (PF) 1 % IJ SOLN
INTRAMUSCULAR | Status: DC | PRN
  Administered 2020-08-19: 1 mL

## 2020-08-19 MED ORDER — LIDOCAINE HCL (PF) 2 % IJ SOLN
INTRAMUSCULAR | Status: AC
  Filled 2020-08-19: qty 5

## 2020-08-19 MED ORDER — MIDAZOLAM HCL 2 MG/2ML IJ SOLN
INTRAMUSCULAR | Status: AC
  Filled 2020-08-19: qty 2

## 2020-08-19 MED ORDER — FAMOTIDINE 20 MG PO TABS
ORAL_TABLET | ORAL | Status: AC
  Administered 2020-08-19: 20 mg via ORAL
  Filled 2020-08-19: qty 1

## 2020-08-19 MED ORDER — PHENYLEPHRINE HCL-NACL 10-0.9 MG/250ML-% IV SOLN
INTRAVENOUS | Status: DC | PRN
  Administered 2020-08-19: 50 ug/min via INTRAVENOUS

## 2020-08-19 SURGICAL SUPPLY — 42 items
APL PRP STRL LF DISP 70% ISPRP (MISCELLANEOUS) ×1
BIT DRILL 2 FAST STEP (BIT) ×3 IMPLANT
BIT DRILL 2.5X4 QC (BIT) ×3 IMPLANT
BNDG CMPR STD VLCR NS LF 5.8X4 (GAUZE/BANDAGES/DRESSINGS) ×1
BNDG ELASTIC 4X5.8 VLCR NS LF (GAUZE/BANDAGES/DRESSINGS) ×3 IMPLANT
BNDG ELASTIC 4X5.8 VLCR STR LF (GAUZE/BANDAGES/DRESSINGS) ×3 IMPLANT
CANISTER SUCT 1200ML W/VALVE (MISCELLANEOUS) ×3 IMPLANT
CHLORAPREP W/TINT 26 (MISCELLANEOUS) ×3 IMPLANT
COVER WAND RF STERILE (DRAPES) ×3 IMPLANT
CUFF TOURN SGL QUICK 18X4 (TOURNIQUET CUFF) IMPLANT
DRAPE FLUOR MINI C-ARM 54X84 (DRAPES) ×3 IMPLANT
ELECT CAUTERY BLADE 6.4 (BLADE) ×3 IMPLANT
ELECT REM PT RETURN 9FT ADLT (ELECTROSURGICAL) ×3
ELECTRODE REM PT RTRN 9FT ADLT (ELECTROSURGICAL) ×1 IMPLANT
GAUZE SPONGE 4X4 12PLY STRL (GAUZE/BANDAGES/DRESSINGS) ×3 IMPLANT
GAUZE XEROFORM 1X8 LF (GAUZE/BANDAGES/DRESSINGS) ×3 IMPLANT
GLOVE SURG SYN 9.0  PF PI (GLOVE) ×2
GLOVE SURG SYN 9.0 PF PI (GLOVE) ×1 IMPLANT
GOWN SRG 2XL LVL 4 RGLN SLV (GOWNS) ×1 IMPLANT
GOWN STRL NON-REIN 2XL LVL4 (GOWNS) ×3
GOWN STRL REUS W/ TWL LRG LVL3 (GOWN DISPOSABLE) ×1 IMPLANT
GOWN STRL REUS W/TWL LRG LVL3 (GOWN DISPOSABLE) ×3
KIT TURNOVER KIT A (KITS) ×3 IMPLANT
NEEDLE FILTER BLUNT 18X 1/2SAF (NEEDLE) ×2
NEEDLE FILTER BLUNT 18X1 1/2 (NEEDLE) ×1 IMPLANT
NS IRRIG 500ML POUR BTL (IV SOLUTION) ×3 IMPLANT
PACK EXTREMITY (MISCELLANEOUS) ×3 IMPLANT
PAD CAST CTTN 4X4 STRL (SOFTGOODS) ×1 IMPLANT
PADDING CAST COTTON 4X4 STRL (SOFTGOODS) ×3
PEG SUBCHONDRAL SMOOTH 2.0X14 (Peg) ×6 IMPLANT
PEG SUBCHONDRAL SMOOTH 2.0X16 (Peg) ×3 IMPLANT
PEG SUBCHONDRAL SMOOTH 2.0X20 (Peg) ×12 IMPLANT
PLATE SHORT 24.4X51.3 RT (Plate) ×3 IMPLANT
SCALPEL PROTECTED #15 DISP (BLADE) ×6 IMPLANT
SCREW CORT 3.5X14 LNG (Screw) ×9 IMPLANT
SLING ARM LRG DEEP (SOFTGOODS) ×3 IMPLANT
SPLINT CAST 1 STEP 3X12 (MISCELLANEOUS) ×3 IMPLANT
SUT ETHILON 4-0 (SUTURE) ×3
SUT ETHILON 4-0 FS2 18XMFL BLK (SUTURE) ×1
SUT VICRYL 3-0 27IN (SUTURE) ×3 IMPLANT
SUTURE ETHLN 4-0 FS2 18XMF BLK (SUTURE) ×1 IMPLANT
SYR 3ML LL SCALE MARK (SYRINGE) ×3 IMPLANT

## 2020-08-19 NOTE — Op Note (Signed)
08/19/2020  1:31 PM  PATIENT:  Charles Cooke  74 y.o. male  PRE-OPERATIVE DIAGNOSIS:  Other closed intra-articular fracture of distal end of right radius, initial encounter S52.571A  POST-OPERATIVE DIAGNOSIS:  Other closed intra-articular fracture of distal end of right radius, initial encounter S52.571A  PROCEDURE:  Procedure(s): Right distal radious open reduction internal fixation (Right)  SURGEON: Laurene Footman, MD  ASSISTANTS: None  ANESTHESIA:   general  EBL:  Total I/O In: 900 [I.V.:700; IV Piggyback:200] Out: 5 [Blood:5]  BLOOD ADMINISTERED:none  DRAINS: none   LOCAL MEDICATIONS USED:  NONE  SPECIMEN:  No Specimen  DISPOSITION OF SPECIMEN:  N/A  COUNTS:  YES  TOURNIQUET:   Total Tourniquet Time Documented: Forearm (Left) - 27 minutes Total: Forearm (Left) - 27 minutes   IMPLANTS: Hand innovations right short standard width DVR plate with multiple smooth pegs and screws  DICTATION: .Dragon Dictation patient was brought to the operating room and after adequate general anesthesia was obtained the right arm was prepped and draped in the usual sterile fashion.  With a tourniquet applied to the upper arm and after prepping and draping the timeout procedure and patient identification procedure completed.  Tourniquet was raised and incision was made over the FCR tendon the tendon sheath incised the tendon retracted radially.  Deep fascia incised and then exposure of the pronator was carried out with elevation of the pronator off the radial side of the distal fragment and shaft.  The fracture had displaced slightly volarly and with the fingertrap traction had been applied the start of the case and the use of a Freer elevator anatomic reduction could be obtained.  The plate was then applied to the volar surface and the 3 cortical screws placed make certain that the distal portion of the plate was in the appropriate position and not getting penetration beyond the dorsal  cortex.  With 3 14 mm screws being used.  Next the distal peg holes were filled using smooth pegs drilling measuring and placing the smooth pegs and locking them into the plate after all 7 pegs have been placed C arm views were obtained with the traction off showing stable reduction of the intra-articular fracture with essentially anatomic alignment.  The wound was irrigated and tourniquet let down wound was closed with 3-0 Vicryl subcutaneously and 4-0 nylon is open up to manner.  Xeroform 4 x 4's web roll and volar splint applied followed by an Ace wrap  PLAN OF CARE: Discharge to home after PACU  PATIENT DISPOSITION:  PACU - hemodynamically stable.

## 2020-08-19 NOTE — Discharge Instructions (Addendum)
AMBULATORY SURGERY  DISCHARGE INSTRUCTIONS   1) The drugs that you were given will stay in your system until tomorrow so for the next 24 hours you should not:  A) Drive an automobile B) Make any legal decisions C) Drink any alcoholic beverage   2) You may resume regular meals tomorrow.  Today it is better to start with liquids and gradually work up to solid foods.  You may eat anything you prefer, but it is better to start with liquids, then soup and crackers, and gradually work up to solid foods.   3) Please notify your doctor immediately if you have any unusual bleeding, trouble breathing, redness and pain at the surgery site, drainage, fever, or pain not relieved by medication.    4) Additional Instructions:        Please contact your physician with any problems or Same Day Surgery at [phone], Monday through Friday 6 am to 4 pm, or [place] at [facility] number at [phone].Keep arm elevated is much as possible through the weekend.   Work on finger motion is much as you can.   Ice to the back of the wrist today and tomorrow should help with pain and swelling.   Leave splint and dressing in place. Pain medicine as directed

## 2020-08-19 NOTE — Transfer of Care (Signed)
Immediate Anesthesia Transfer of Care Note  Patient: Charles Cooke  Procedure(s) Performed: Right distal radious open reduction internal fixation (Right Wrist)  Patient Location: PACU  Anesthesia Type:General  Level of Consciousness: awake, drowsy and patient cooperative  Airway & Oxygen Therapy: Patient Spontanous Breathing  Post-op Assessment: Report given to RN and Post -op Vital signs reviewed and stable  Post vital signs: Reviewed and stable  Last Vitals:  Vitals Value Taken Time  BP    Temp 36.1 C 08/19/20 1332  Pulse 67 08/19/20 1332  Resp 12 08/19/20 1332  SpO2 100 % 08/19/20 1332    Last Pain:  Vitals:   08/19/20 1332  TempSrc:   PainSc: 0-No pain         Complications: No complications documented.

## 2020-08-19 NOTE — Anesthesia Procedure Notes (Signed)
Procedure Name: LMA Insertion Date/Time: 08/19/2020 12:29 PM Performed by: Lowry Bowl, CRNA Pre-anesthesia Checklist: Patient identified, Emergency Drugs available, Suction available and Patient being monitored Patient Re-evaluated:Patient Re-evaluated prior to induction Oxygen Delivery Method: Circle system utilized Preoxygenation: Pre-oxygenation with 100% oxygen Induction Type: IV induction Ventilation: Mask ventilation without difficulty LMA: LMA inserted LMA Size: 4.5 Number of attempts: 1 Placement Confirmation: positive ETCO2 and breath sounds checked- equal and bilateral Tube secured with: Tape Dental Injury: Teeth and Oropharynx as per pre-operative assessment

## 2020-08-19 NOTE — H&P (Signed)
Chief Complaint  Patient presents with  . Right Wrist - Fracture   Charles Cooke is a 74 y.o. male who presents today for evaluation of displaced right distal radius fracture. Patient suffered a fall 08/11/2020. Patient states he tripped going down steps, landed on an outstretched right hand. He was evaluated in the walk-in clinic where x-rays showed a comminuted displaced distal radial metaphyseal fracture with intra-articular extension. He has been taking Norco 5-3 25, 1 tablet every 6 hours with good relief. He denies any numbness or tingling. No other pain throughout his body except for the wrist. He has been wearing a sling and volar splint with good relief. Patient is active, he is right-hand dominant. He enjoys exercising in the park.  Past Medical History: Past Medical History:  Diagnosis Date  . Diabetes mellitus type 2, uncomplicated (CMS-HCC)  . Hyperlipidemia  . Hypertension  . Osteoarthritis  . Prostate cancer (CMS-HCC) 04/27/2013   Past Surgical History: Past Surgical History:  Procedure Laterality Date  . HERNIA REPAIR Bilateral  Inguinal  . JOINT REPLACEMENT Right 08/18/2018  medial makoplasty   Past Family History: Family History  Problem Relation Age of Onset  . Diabetes type II Mother  . Cancer Father   Medications: Current Outpatient Medications Ordered in Epic  Medication Sig Dispense Refill  . aspirin 81 MG EC tablet Take 81 mg by mouth 2 (two) times daily  . atorvastatin (LIPITOR) 40 MG tablet Take 80 mg by mouth once daily  . HYDROcodone-acetaminophen (NORCO) 5-325 mg tablet Take 1 tablet by mouth every 6 (six) hours as needed for Pain for up to 20 doses 20 tablet 0  . HYDROcodone-acetaminophen (NORCO) 5-325 mg tablet Take 1 tablet by mouth every 6 (six) hours as needed 20 tablet 0  . lisinopril (PRINIVIL,ZESTRIL) 2.5 MG tablet Take 2.5 mg by mouth once daily  . metFORMIN (GLUCOPHAGE) 500 MG tablet Take 500 mg by mouth.  . oxybutynin (DITROPAN-XL) 10  MG XL tablet Take 10 mg by mouth once daily 6  . tamsulosin (FLOMAX) 0.4 mg capsule Take 0.4 mg by mouth once daily. Take 30 minutes after same meal each day.   No current Epic-ordered facility-administered medications on file.   Allergies: No Known Allergies   Review of Systems:  A comprehensive 14 point ROS was performed, reviewed by me today, and the pertinent orthopaedic findings are documented in the HPI.  Exam: BP 112/74  Pulse 94  Ht 165.1 cm (5\' 5" )  Wt 89.5 kg (197 lb 6.4 oz)  SpO2 99%  BMI 32.85 kg/m   General:  Well developed, well nourished, no apparent distress, normal affect, normal gait with no antalgic component.   HEENT: Head normocephalic, atraumatic, PERRL.   Abdomen: Soft, non tender, non distended, Bowel sounds present.  Heart: Examination of the heart reveals regular, rate, and rhythm. There is no murmur noted on ascultation. There is a normal apical pulse.  Lungs: Lungs are clear to auscultation. There is no wheeze, rhonchi, or crackles. There is normal expansion of bilateral chest walls.   Right upper extremity: Examination of the right wrist shows tenderness along the distal radial metaphysis with no ulnar styloid tenderness. No skin breakdown noted. Mild swelling. Mild swelling throughout the digits. He is able to make a fist. Normal sensation throughout the digits. 2+ radial pulse and 2+ cap refill.  X-rays of the right wrist reviewed by me from 08/11/2020 show comminuted displaced intra-articular distal radial metaphysis fracture with impaction with a large displaced dorsal  fragment. Patient has suffered some shortening of the distal radius.  Impression: Other closed intra-articular fracture of distal end of right radius, initial encounter [S52.571A] Comminuted displaced closed right intra-articular distal radius fracture, initial encounter (primary encounter diagnosis)  Plan:  7. 74 year old male with acute displaced comminuted distal radial  metaphyseal fracture. Patient is right-hand dominant and active. Due to severity and displacement of fracture, we discussed distal radius ORIF. Risks, benefits, complications of a right distal radius ORIF have been discussed with the patient. Patient has agreed and consented the procedure with Dr. Hessie Knows. We will try to schedule the surgery for this Thursday or Friday. He will continue with volar splint and sling. Work on digit range of motion. He is given a refill of Norco. We will continue with stool softeners as needed.  This note was generated in part with voice recognition software and I apologize for any typographical errors that were not detected and corrected.  Feliberto Gottron MPA-C    Electronically signed by Feliberto Gottron, PA at 08/16/2020 9:14 AM EDT  Reviewed H+P, No changes noted.

## 2020-08-19 NOTE — Anesthesia Postprocedure Evaluation (Signed)
Anesthesia Post Note  Patient: Charles Cooke  Procedure(s) Performed: Right distal radious open reduction internal fixation (Right Wrist)  Patient location during evaluation: PACU Anesthesia Type: General Level of consciousness: awake and alert and oriented Pain management: pain level controlled Vital Signs Assessment: post-procedure vital signs reviewed and stable Respiratory status: spontaneous breathing Cardiovascular status: blood pressure returned to baseline Anesthetic complications: no   No complications documented.   Last Vitals:  Vitals:   08/19/20 1419 08/19/20 1430  BP: 120/88 109/78  Pulse: 64 82  Resp: 15 16  Temp: 36.7 C (!) 36.4 C  SpO2: 95% 96%    Last Pain:  Vitals:   08/19/20 1430  TempSrc: Tympanic  PainSc: 0-No pain                 Kattie Santoyo

## 2020-08-19 NOTE — Anesthesia Preprocedure Evaluation (Signed)
Anesthesia Evaluation  Patient identified by MRN, date of birth, ID band Patient awake    Reviewed: Allergy & Precautions, H&P , NPO status , Patient's Chart, lab work & pertinent test results  History of Anesthesia Complications Negative for: history of anesthetic complications  Airway Mallampati: II  TM Distance: >3 FB     Dental  (+) Caps   Pulmonary neg pulmonary ROS, neg sleep apnea, neg COPD,    breath sounds clear to auscultation       Cardiovascular hypertension, (-) angina(-) Past MI and (-) Cardiac Stents (-) dysrhythmias  Rhythm:regular Rate:Normal     Neuro/Psych negative neurological ROS  negative psych ROS   GI/Hepatic negative GI ROS, Neg liver ROS,   Endo/Other  diabetes  Renal/GU      Musculoskeletal   Abdominal   Peds  Hematology negative hematology ROS (+)   Anesthesia Other Findings Past Medical History: No date: Diabetes mellitus without complication (HCC) No date: Hypertension No date: Prostate cancer [facility])  Past Surgical History: 2004: HERNIA REPAIR; Bilateral     Comment:  inguinal hernias No date: JOINT REPLACEMENT No date: prostate cancer  BMI    Body Mass Index: 32.65 kg/m      Reproductive/Obstetrics negative OB ROS                             Anesthesia Physical Anesthesia Plan  ASA: II  Anesthesia Plan: General LMA   Post-op Pain Management: GA combined w/ Regional for post-op pain   Induction:   PONV Risk Score and Plan: Dexamethasone, Ondansetron and Treatment may vary due to age or medical condition  Airway Management Planned:   Additional Equipment:   Intra-op Plan:   Post-operative Plan:   Informed Consent: I have reviewed the patients History and Physical, chart, labs and discussed the procedure including the risks, benefits and alternatives for the proposed anesthesia with the patient or authorized representative who has  indicated his/her understanding and acceptance.     Dental Advisory Given  Plan Discussed with: Anesthesiologist, CRNA and Surgeon  Anesthesia Plan Comments:         Anesthesia Quick Evaluation

## 2020-08-19 NOTE — Anesthesia Procedure Notes (Signed)
Anesthesia Regional Block: Supraclavicular block   Pre-Anesthetic Checklist: ,, timeout performed, Correct Patient, Correct Site, Correct Laterality, Correct Procedure, Correct Position, site marked, Risks and benefits discussed,  Surgical consent,  Pre-op evaluation,  At surgeon's request and post-op pain management  Laterality: Right  Prep: chloraprep       Needles:  Injection technique: Single-shot  Needle Type: Stimiplex     Needle Length: 9cm  Needle Gauge: 21     Additional Needles:   Procedures:,,,, ultrasound used (permanent image in chart),,,,  Narrative:  Start time: 08/19/2020 12:09 PM End time: 08/19/2020 12:12 PM  Performed by: Personally  Anesthesiologist: Tera Mater, MD  Additional Notes: Risks and benefits of nerve block discussed with patient, including but not limited to risk of nerve injury, bleeding, infection, and failed block.  Patient expressed understanding and consented to block placement.   Functioning IV was confirmed and monitors were applied.  Sterile prep,hand hygiene and sterile gloves were used.  Minimal sedation used for procedure.  During the procedure, there was negative aspiration, negative paresthesia on injection, and dose was given in divided aliquots under ultrasound guidance.  Patient tolerated the procedure well with no immediate complications.    Consent obtained with Spanish interpreter present.  KR

## 2020-08-22 ENCOUNTER — Encounter: Payer: Self-pay | Admitting: Orthopedic Surgery

## 2020-09-26 ENCOUNTER — Ambulatory Visit: Payer: Medicare Other | Admitting: Urology

## 2020-09-29 ENCOUNTER — Ambulatory Visit: Payer: Medicare Other | Admitting: Urology

## 2020-10-20 ENCOUNTER — Ambulatory Visit: Payer: Medicare Other | Admitting: Urology

## 2020-11-17 IMAGING — DX DG WRIST 2V*R*
2 series · 2 of 2 positions shown · non-contrast
Comparison: None.

CLINICAL DATA: Open reduction internal fixation for fracture

EXAM:
RIGHT WRIST - 2 VIEW

[wrist ap]
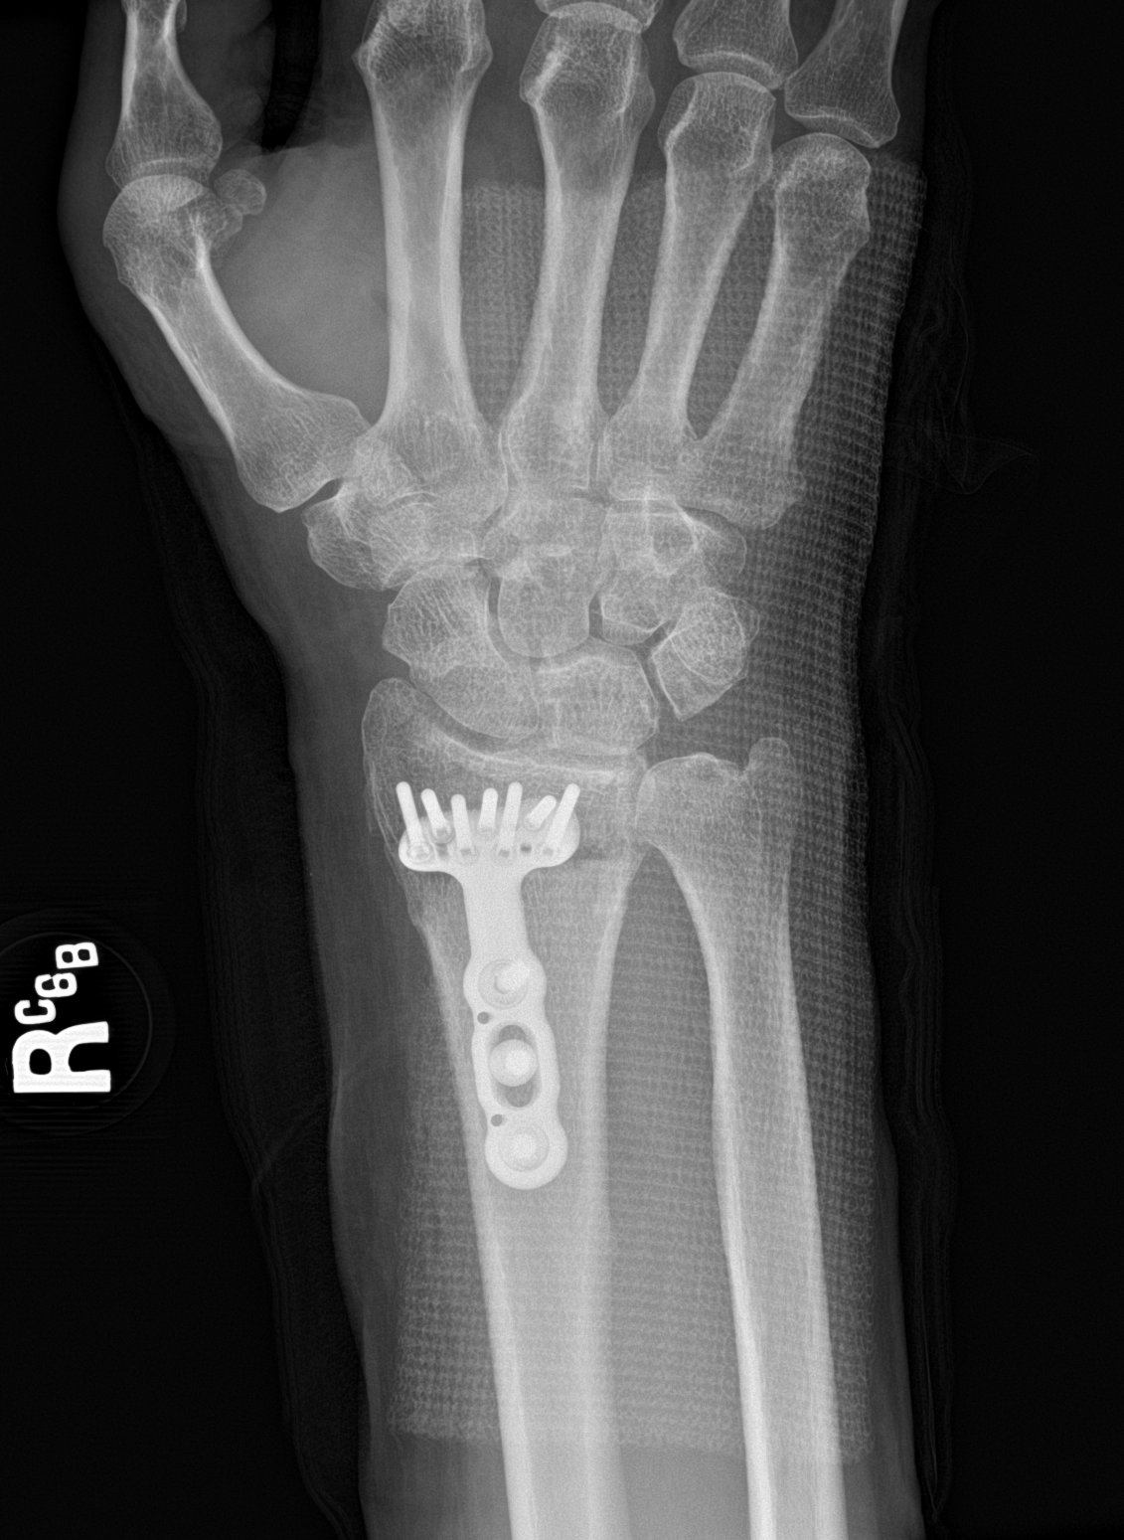

[wrist lat]
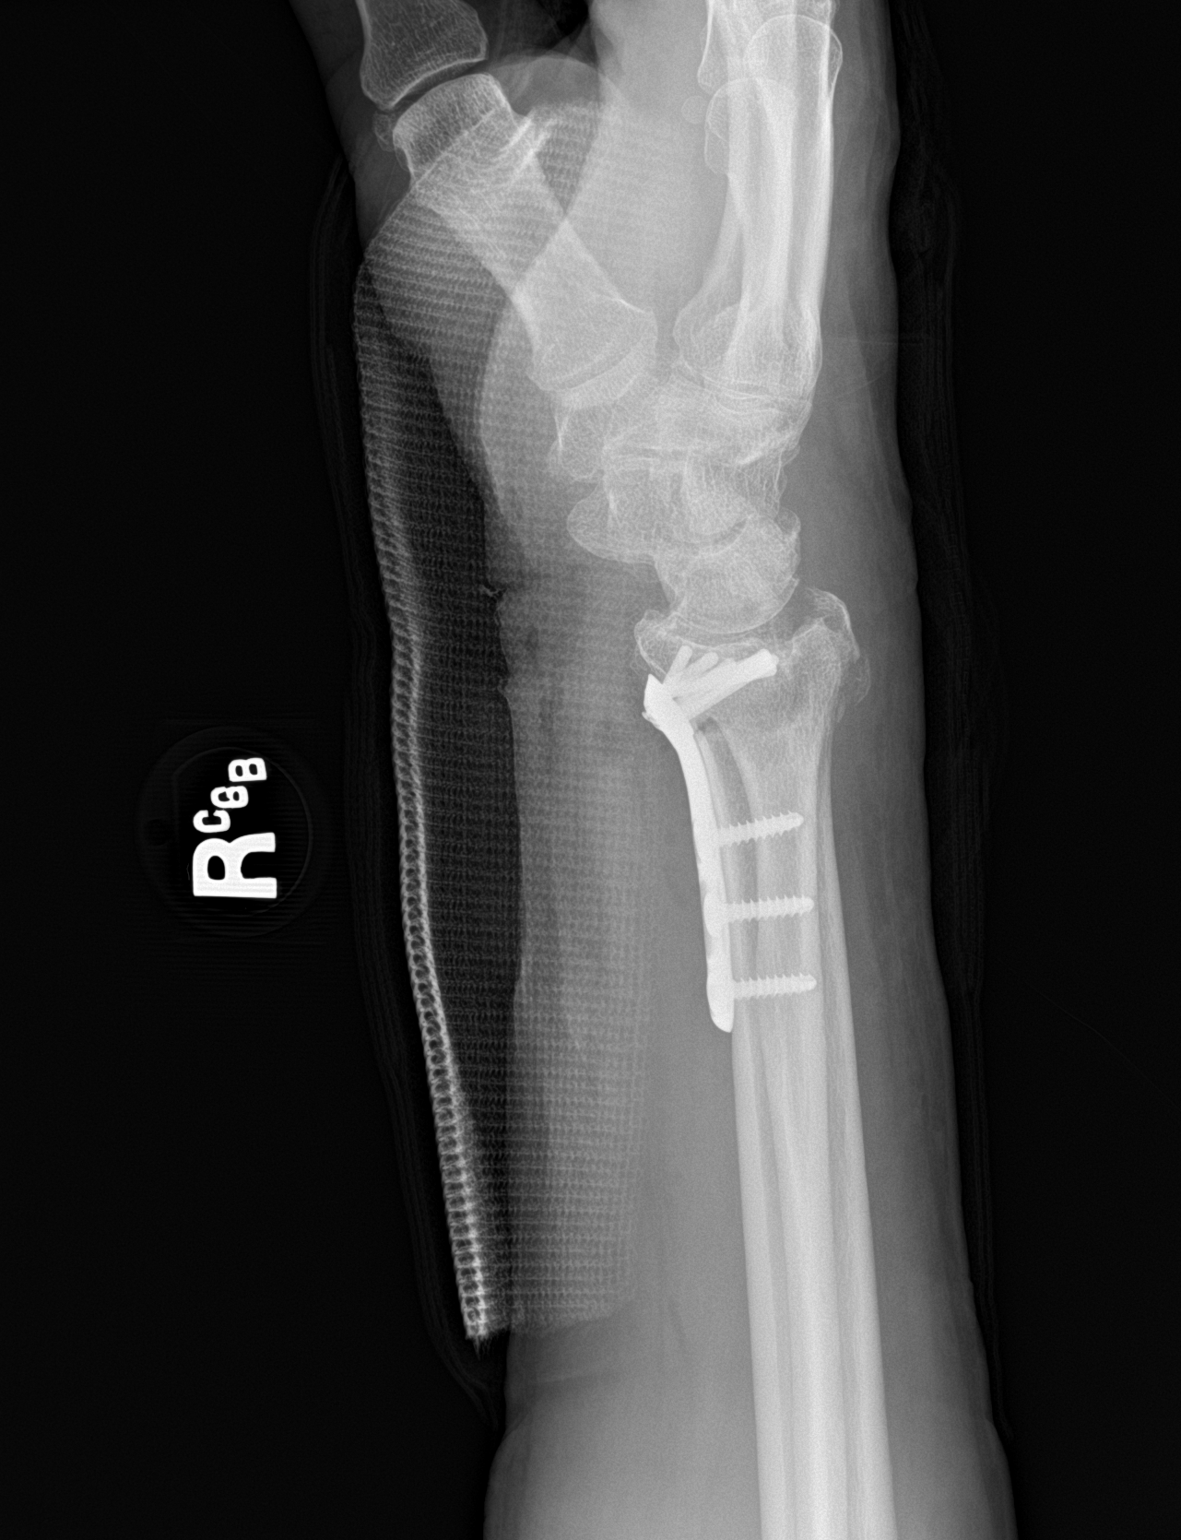

[2 of 2 positions shown; findings below may reference images not displayed]

FINDINGS: Frontal and lateral views were obtained. There is screw and plate
fixation through a comminuted fracture of the distal radial
metaphysis. Alignment at the fracture site is near anatomic.
Fracture fragments extend into the radiocarpal joint. No other
fractures are evident. No dislocation. No appreciable joint space
narrowing.
IMPRESSION: Screw and plate fixation through a comminuted fracture of the distal
radial metaphysis. Note that there are fracture fragments extending
into the radiocarpal joint. Overall alignment is near anatomic at
the fracture site. No dislocation. No other fractures. No
appreciable arthropathy.

## 2023-08-05 ENCOUNTER — Other Ambulatory Visit: Payer: Self-pay | Admitting: Student

## 2023-08-05 DIAGNOSIS — M12811 Other specific arthropathies, not elsewhere classified, right shoulder: Secondary | ICD-10-CM

## 2023-08-06 ENCOUNTER — Encounter: Payer: Self-pay | Admitting: Student

## 2023-08-09 ENCOUNTER — Ambulatory Visit
Admission: RE | Admit: 2023-08-09 | Discharge: 2023-08-09 | Disposition: A | Discharge status: Home / Self Care | Payer: Medicare HMO | Source: Ambulatory Visit | Attending: Student | Admitting: Student

## 2023-08-09 DIAGNOSIS — M12811 Other specific arthropathies, not elsewhere classified, right shoulder: Secondary | ICD-10-CM

## 2024-01-10 ENCOUNTER — Encounter: Payer: Self-pay | Admitting: Urgent Care

## 2024-01-16 ENCOUNTER — Inpatient Hospital Stay: Admission: RE | Admit: 2024-01-16 | Payer: Medicare HMO | Source: Ambulatory Visit

## 2024-01-21 ENCOUNTER — Encounter: Admission: RE | Payer: Self-pay | Source: Home / Self Care

## 2024-01-21 ENCOUNTER — Ambulatory Visit: Admission: RE | Admit: 2024-01-21 | Payer: Medicare HMO | Source: Home / Self Care | Admitting: Surgery

## 2024-01-21 SURGERY — REVERSE SHOULDER ARTHROPLASTY
Anesthesia: Choice | Site: Shoulder | Laterality: Right
